# Patient Record
Sex: Male | Born: 1970 | Race: White | Hispanic: No | Marital: Single | Smoking: Former smoker
Health system: Southern US, Community
[De-identification: ages and names within clinical notes are randomized; demographics above are authoritative.]

## PROBLEM LIST (undated history)

## (undated) DIAGNOSIS — Z720 Tobacco use: Secondary | ICD-10-CM

## (undated) DIAGNOSIS — I428 Other cardiomyopathies: Secondary | ICD-10-CM

## (undated) DIAGNOSIS — E785 Hyperlipidemia, unspecified: Secondary | ICD-10-CM

## (undated) DIAGNOSIS — R197 Diarrhea, unspecified: Secondary | ICD-10-CM

## (undated) DIAGNOSIS — F419 Anxiety disorder, unspecified: Secondary | ICD-10-CM

## (undated) DIAGNOSIS — Z992 Dependence on renal dialysis: Secondary | ICD-10-CM

## (undated) DIAGNOSIS — E669 Obesity, unspecified: Secondary | ICD-10-CM

## (undated) DIAGNOSIS — I2510 Atherosclerotic heart disease of native coronary artery without angina pectoris: Secondary | ICD-10-CM

## (undated) DIAGNOSIS — M109 Gout, unspecified: Secondary | ICD-10-CM

## (undated) DIAGNOSIS — R002 Palpitations: Secondary | ICD-10-CM

## (undated) DIAGNOSIS — J189 Pneumonia, unspecified organism: Secondary | ICD-10-CM

## (undated) DIAGNOSIS — I509 Heart failure, unspecified: Secondary | ICD-10-CM

## (undated) DIAGNOSIS — G2581 Restless legs syndrome: Secondary | ICD-10-CM

## (undated) DIAGNOSIS — K219 Gastro-esophageal reflux disease without esophagitis: Secondary | ICD-10-CM

## (undated) DIAGNOSIS — E611 Iron deficiency: Secondary | ICD-10-CM

## (undated) DIAGNOSIS — I10 Essential (primary) hypertension: Secondary | ICD-10-CM

## (undated) DIAGNOSIS — N200 Calculus of kidney: Secondary | ICD-10-CM

## (undated) DIAGNOSIS — J40 Bronchitis, not specified as acute or chronic: Secondary | ICD-10-CM

## (undated) DIAGNOSIS — Q613 Polycystic kidney, unspecified: Secondary | ICD-10-CM

## (undated) DIAGNOSIS — G4730 Sleep apnea, unspecified: Secondary | ICD-10-CM

## (undated) DIAGNOSIS — Z87442 Personal history of urinary calculi: Secondary | ICD-10-CM

## (undated) HISTORY — PX: ANKLE FRACTURE SURGERY: SHX122

## (undated) HISTORY — DX: Palpitations: R00.2

## (undated) HISTORY — DX: Essential (primary) hypertension: I10

## (undated) HISTORY — PX: BUNIONECTOMY: SHX129

## (undated) HISTORY — PX: CARDIAC CATHETERIZATION: SHX172

## (undated) HISTORY — DX: Obesity, unspecified: E66.9

## (undated) HISTORY — DX: Polycystic kidney, unspecified: Q61.3

## (undated) HISTORY — DX: Hyperlipidemia, unspecified: E78.5

## (undated) HISTORY — PX: KNEE ARTHROSCOPY W/ ACL RECONSTRUCTION: SHX1858

## (undated) HISTORY — DX: Gastro-esophageal reflux disease without esophagitis: K21.9

---

## 1998-03-18 ENCOUNTER — Encounter: Admission: RE | Admit: 1998-03-18 | Discharge: 1998-06-16 | Payer: Self-pay | Admitting: Nephrology

## 1998-04-22 ENCOUNTER — Encounter: Payer: Self-pay | Admitting: Emergency Medicine

## 1998-04-22 ENCOUNTER — Emergency Department (HOSPITAL_COMMUNITY): Admission: EM | Admit: 1998-04-22 | Discharge: 1998-04-22 | Payer: Self-pay | Admitting: Emergency Medicine

## 1999-01-26 ENCOUNTER — Encounter: Payer: Self-pay | Admitting: Emergency Medicine

## 1999-01-26 ENCOUNTER — Emergency Department (HOSPITAL_COMMUNITY): Admission: EM | Admit: 1999-01-26 | Discharge: 1999-01-26 | Payer: Self-pay | Admitting: Emergency Medicine

## 1999-01-27 ENCOUNTER — Encounter: Payer: Self-pay | Admitting: Emergency Medicine

## 2000-02-01 ENCOUNTER — Encounter: Payer: Self-pay | Admitting: Urology

## 2000-02-01 ENCOUNTER — Ambulatory Visit (HOSPITAL_COMMUNITY): Admission: RE | Admit: 2000-02-01 | Discharge: 2000-02-01 | Payer: Self-pay | Admitting: Urology

## 2000-02-05 ENCOUNTER — Encounter: Payer: Self-pay | Admitting: Gastroenterology

## 2000-02-05 ENCOUNTER — Encounter: Admission: RE | Admit: 2000-02-05 | Discharge: 2000-02-05 | Payer: Self-pay | Admitting: Gastroenterology

## 2000-04-05 ENCOUNTER — Encounter: Payer: Self-pay | Admitting: Gastroenterology

## 2000-04-05 ENCOUNTER — Ambulatory Visit (HOSPITAL_COMMUNITY): Admission: RE | Admit: 2000-04-05 | Discharge: 2000-04-05 | Payer: Self-pay | Admitting: Gastroenterology

## 2004-01-15 ENCOUNTER — Emergency Department (HOSPITAL_COMMUNITY): Admission: EM | Admit: 2004-01-15 | Discharge: 2004-01-15 | Payer: Self-pay

## 2004-04-19 ENCOUNTER — Emergency Department (HOSPITAL_COMMUNITY): Admission: EM | Admit: 2004-04-19 | Discharge: 2004-04-20 | Payer: Self-pay | Admitting: Emergency Medicine

## 2006-11-27 ENCOUNTER — Encounter: Admission: RE | Admit: 2006-11-27 | Discharge: 2006-11-27 | Payer: Self-pay | Admitting: Nephrology

## 2010-06-09 NOTE — Op Note (Signed)
[place]. [facility]  Patient:    Harold Freeman                        MRN: [id number] Proc. Date: 01/27/99 Adm. Date:  [id number] Attending:  Marlowe Shores                           Operative Report  PREOPERATIVE DIAGNOSIS: Right metacarpal phalangeal joint dislocation.  POSTOPERATIVE DIAGNOSIS: Right metacarpal phalangeal joint dislocation.  PROCEDURE: Closed reduction of above dislocation.  SURGEON: Artist Pais. Mina Marble, M.D.  ANESTHESIA: General.  COMPLICATIONS: None.  DRAINS: None.  DESCRIPTION OF PROCEDURE: The patient was taken to the operating room and after the induction of adequate general anesthesia the right upper extremity was placed in extreme wrist flexion.  Longitudinal traction was then applied to the thumb and  proximal phalanx.  After a period of five minutes of longitudinal traction reduction reduction maneuver was performed.  The metacarpophalangeal joint dislocation was reduced.  Intraoperative x-ray showed adequate reduction in both the AP and lateral plane.  The right upper extremity was then placed in a thumb  spica well-padded short-arm splint and the patient was then taken to the recovery room in stable condition. DD:  01/27/99 TD:  01/27/99 Job: [id number]

## 2010-06-09 NOTE — Op Note (Signed)
[place]. [facility]  Patient:    Harold Freeman                        MRN: [id number] Proc. Date: 01/27/99 Adm. Date:  [id number] Attending:  Marlowe Shores                           Operative Report  PHYSICIAN REQUESTING CONSULTATION:  Dr. Mechele Collin L. Wentz.  REASON FOR CONSULTATION:  Right thumb injury while playing basketball.  HISTORY OF PRESENT ILLNESS:  [name] is a very pleasant 40 year old right-hand-dominant male who was playing basketball earlier this evening and sustained an injury to his dominant right thumb and comes in today with a chief  complaint of pain and deformity.  He is an otherwise very pleasant and healthy 40 year old white male with no known drug allergies.  He is currently take Glucophage for non-insulin-dependent diabetes and has been doing this for about six months.  PAST MEDICAL HISTORY:  Significant for non-insulin-dependent diabetes mellitus, as previously state.  No other significant past medical history.  MEDICATIONS:  No other significant medications.  FAMILY MEDICAL HISTORY:  Noncontributory.  SOCIAL HISTORY:  Noncontributory.  REVIEW OF SYSTEMS:  Noncontributory.  PHYSICAL EXAMINATION:  GENERAL:  He is a well-developed, well-nourished male, pleasant, alert and oriented x 3.  HEENT:  Normocephalic, atraumatic.  HEART:  Regular rate and rhythm.  CHEST:  Clear.  LUNGS:  Clear.  ABDOMEN:  Soft, nontender.  Bowel sounds positive.  No masses felt, etc.  EXTREMITIES:  Examination of his extremity on the right is consistent for an obvious deformity of the metacarpophalangeal joint with hyperextension of the phalanx on the metacarpal head; there is also pain over the volar aspect of the  metacarpophalangeal joint and a prominent metacarpal head.  He is able to actively flex the IP joint but this is quite painful.  He has a 2+ radial pulse and brisk capillary refill.  No signs consistent with  neurovascular compromise.  X-RAY FINDINGS:  X-rays in the emergency department show what appear to be a complex dislocation of the metacarpophalangeal joint with interposition of either the volar plate or FPL tendon in the joint, with the sesamoid bone firmly locked in the joint space.  DESCRIPTION OF PROCEDURE:  Patient was given a 2% lidocaine wrist block in addition to a block of the thumb itself and after five minute, traction was applied, a closed reduction was performed but this appears to be a complex-type dislocation. Patient was then prepped from the emergency room to go to the operating room for a closed-versus-open reduction of a complex right thumb metacarpophalangeal joint  dislocation. DD:  01/27/99 TD:  01/27/99 Job: [id number]

## 2011-03-21 ENCOUNTER — Other Ambulatory Visit: Payer: Self-pay

## 2011-03-21 DIAGNOSIS — Z01810 Encounter for preprocedural cardiovascular examination: Secondary | ICD-10-CM

## 2011-03-21 DIAGNOSIS — N184 Chronic kidney disease, stage 4 (severe): Secondary | ICD-10-CM

## 2011-04-09 ENCOUNTER — Encounter: Payer: Self-pay | Admitting: Surgery

## 2011-04-13 ENCOUNTER — Encounter: Payer: Self-pay | Admitting: Surgery

## 2011-04-16 ENCOUNTER — Ambulatory Visit (INDEPENDENT_AMBULATORY_CARE_PROVIDER_SITE_OTHER): Payer: Managed Care, Other (non HMO) | Admitting: Surgery

## 2011-04-16 ENCOUNTER — Encounter (INDEPENDENT_AMBULATORY_CARE_PROVIDER_SITE_OTHER): Payer: Managed Care, Other (non HMO) | Admitting: *Deleted

## 2011-04-16 ENCOUNTER — Encounter: Payer: Self-pay | Admitting: Surgery

## 2011-04-16 VITALS — BP 142/91 | HR 88 | Resp 16 | Ht 73.0 in | Wt 278.0 lb

## 2011-04-16 DIAGNOSIS — Z01810 Encounter for preprocedural cardiovascular examination: Secondary | ICD-10-CM

## 2011-04-16 DIAGNOSIS — N186 End stage renal disease: Secondary | ICD-10-CM

## 2011-04-16 DIAGNOSIS — Q612 Polycystic kidney, adult type: Secondary | ICD-10-CM

## 2011-04-16 DIAGNOSIS — N184 Chronic kidney disease, stage 4 (severe): Secondary | ICD-10-CM

## 2011-04-16 NOTE — Progress Notes (Signed)
[facility] [place]   Patient name: Harold Freeman MRN: [id number] DOB: 05-30-1970 Sex: male   Referred by: Dr. Detterding  Reason for referral:  Chief Complaint  Patient presents with  . New Evaluation    AVF Ref. by Dr. Darrick Penna - with Labs     HISTORY OF PRESENT ILLNESS: The patient comes in today for evaluation of dialysis access. He is right-handed. His renal disease is secondary to polycystic kidney disease. Several family members are already on dialysis including his sister and mother. The patient also suffers and diabetes. His blood sugars were in the 100-125 range. He is a smoker and has been so for greater than 10 years. He is not yet on dialysis. He is also medically managed for his hypertension and hypercholesterolemia  Past Medical History  Diagnosis Date  . Polycystic kidney disease   . Arthritis     gout  . Diabetes mellitus   . Obesity   . Hypertension   . Hyperlipidemia   . GERD (gastroesophageal reflux disease)     History reviewed. No pertinent past surgical history.  History   Social History  . Marital Status: Married    Spouse Name: N/A    Number of Children: N/A  . Years of Education: N/A   Occupational History  . Not on file.   Social History Main Topics  . Smoking status: Current Everyday Smoker  . Smokeless tobacco: Not on file  . Alcohol Use: No  . Drug Use: No  . Sexually Active:    Other Topics Concern  . Not on file   Social History Narrative  . No narrative on file    Family History  Problem Relation Age of Onset  . Diabetes Mother   . Hypertension Mother   . Cancer Father   . Diabetes Father   . Hypertension Father   . Diabetes Sister     Allergies as of 04/16/2011  . (Not on File)    Current Outpatient Prescriptions on File Prior to Visit  Medication Sig Dispense Refill  . aspirin 81 MG tablet Take 81 mg by mouth daily.      Marland Kitchen glimepiride (AMARYL) 2 MG tablet Take 2 mg by mouth daily before  breakfast.      . Multiple Vitamin (MULTIVITAMIN) tablet Take 1 tablet by mouth daily.      Marland Kitchen omeprazole (PRILOSEC) 20 MG capsule Take 20 mg by mouth daily.      . potassium chloride (KLOR-CON) 20 MEQ packet Take 20 mEq by mouth as needed.      Marland Kitchen SIMVASTATIN PO Take by mouth.      . telmisartan (MICARDIS) 80 MG tablet Take 80 mg by mouth daily.         REVIEW OF SYSTEMS: Cardiovascular: No chest pain, chest pressure, palpitations, orthopnea, or dyspnea on exertion. No claudication or rest pain,  No history of DVT or phlebitis. Pulmonary: No productive cough, asthma or wheezing. Neurologic: No weakness, paresthesias, aphasia, or amaurosis. No dizziness. Hematologic: No bleeding problems or clotting disorders. Musculoskeletal: No joint pain or joint swelling. Gastrointestinal: No blood in stool or hematemesis Genitourinary: No dysuria or hematuria. Psychiatric:: No history of major depression. Integumentary: No rashes or ulcers. Constitutional: No fever or chills.  PHYSICAL EXAMINATION: General: The patient appears their stated age.  Vital signs are BP 142/91  Pulse 88  Resp 16  Ht 6\' 1"  (1.854 m)  Wt 278 lb (126.1 kg)  BMI 36.68 kg/m2  SpO2  97% HEENT:  No gross abnormalities Pulmonary: Respirations are non-labored Abdomen: Soft and non-tender  Musculoskeletal: There are no major deformities.   Neurologic: No focal weakness or paresthesias are detected, Skin: There are no ulcer or rashes noted. Psychiatric: The patient has normal affect. Cardiovascular: There is a regular rate and rhythm without significant murmur appreciated. Palpable left radial left brachial pulse  Diagnostic Studies: Vein mapping was performed today which shows an excellent left cephalic vein it measures 0.38 in the distal arm.  Assessment:  Chronic renal insufficiency Plan: The patient is been scheduled for a left radiocephalic fistula to be performed on Friday, April 26. The risks and benefits of the  procedure were discussed with the patient including the risk of steal syndrome in the risk of non-maturity as well as the risk and need for future interventions. All of his questions were answered today.     Jorge Ny, M.D. [facility] [place] Office: [phone] Pager:  [phone]

## 2011-04-30 NOTE — Procedures (Unsigned)
CEPHALIC VEIN MAPPING  INDICATION:  Preop for AV fistula placement, polycystic kidney disease.  HISTORY:  EXAM:  The right cephalic vein is compressible with diameter measurements ranging from 0.37 to 0.56 cm.  The right basilic vein is compressible with diameter measurements ranging from 0.42 to 0.56 cm.  The left cephalic vein is compressible with diameter measurements ranging from 0.37 to 0.49 cm.  The left basilic vein is compressible with diameter measurements ranging from 0.23 to 0.43 cm.  See attached worksheet for all measurements.  IMPRESSION:  Patent bilateral cephalic and basilic veins with diameter measurements described above.  ___________________________________________ V. Charlena Cross, MD  CH/MEDQ  D:  04/17/2011  T:  04/17/2011  Job:  [id number]

## 2011-05-09 ENCOUNTER — Other Ambulatory Visit: Payer: Self-pay | Admitting: *Deleted

## 2011-05-09 ENCOUNTER — Encounter: Payer: Self-pay | Admitting: *Deleted

## 2011-05-10 ENCOUNTER — Encounter (HOSPITAL_COMMUNITY): Payer: Self-pay | Admitting: Pharmacy Technician

## 2011-05-17 ENCOUNTER — Encounter (HOSPITAL_COMMUNITY): Payer: Self-pay

## 2011-05-17 MED ORDER — SODIUM CHLORIDE 0.9 % IV SOLN
INTRAVENOUS | Status: DC
  Administered 2011-05-18: 10:00:00 via INTRAVENOUS

## 2011-05-17 MED ORDER — DEXTROSE 5 % IV SOLN
1.5000 g | INTRAVENOUS | Status: AC
  Administered 2011-05-18: 1.5 g via INTRAVENOUS
  Filled 2011-05-17: qty 1.5

## 2011-05-18 ENCOUNTER — Encounter (HOSPITAL_COMMUNITY): Payer: Self-pay | Admitting: Certified Registered Nurse Anesthetist

## 2011-05-18 ENCOUNTER — Encounter (HOSPITAL_COMMUNITY): Payer: Self-pay | Admitting: *Deleted

## 2011-05-18 ENCOUNTER — Encounter (HOSPITAL_COMMUNITY): Payer: Self-pay | Admitting: Anesthesiology

## 2011-05-18 ENCOUNTER — Encounter (HOSPITAL_COMMUNITY)
Admission: RE | Disposition: A | Discharge status: Home / Self Care | Payer: Self-pay | Source: Ambulatory Visit | Attending: Vascular Surgery

## 2011-05-18 ENCOUNTER — Ambulatory Visit (HOSPITAL_COMMUNITY): Payer: Managed Care, Other (non HMO)

## 2011-05-18 ENCOUNTER — Telehealth: Payer: Self-pay | Admitting: Surgery

## 2011-05-18 ENCOUNTER — Ambulatory Visit (HOSPITAL_COMMUNITY): Payer: Managed Care, Other (non HMO) | Admitting: Anesthesiology

## 2011-05-18 ENCOUNTER — Ambulatory Visit (HOSPITAL_COMMUNITY)
Admission: RE | Admit: 2011-05-18 | Discharge: 2011-05-18 | Disposition: A | Discharge status: Home / Self Care | Payer: Managed Care, Other (non HMO) | Source: Ambulatory Visit | Attending: Vascular Surgery | Admitting: Vascular Surgery

## 2011-05-18 DIAGNOSIS — N184 Chronic kidney disease, stage 4 (severe): Secondary | ICD-10-CM | POA: Insufficient documentation

## 2011-05-18 DIAGNOSIS — N186 End stage renal disease: Secondary | ICD-10-CM

## 2011-05-18 DIAGNOSIS — Z87891 Personal history of nicotine dependence: Secondary | ICD-10-CM | POA: Insufficient documentation

## 2011-05-18 DIAGNOSIS — Q613 Polycystic kidney, unspecified: Secondary | ICD-10-CM | POA: Insufficient documentation

## 2011-05-18 DIAGNOSIS — I129 Hypertensive chronic kidney disease with stage 1 through stage 4 chronic kidney disease, or unspecified chronic kidney disease: Secondary | ICD-10-CM | POA: Insufficient documentation

## 2011-05-18 DIAGNOSIS — E119 Type 2 diabetes mellitus without complications: Secondary | ICD-10-CM | POA: Insufficient documentation

## 2011-05-18 DIAGNOSIS — K219 Gastro-esophageal reflux disease without esophagitis: Secondary | ICD-10-CM | POA: Insufficient documentation

## 2011-05-18 HISTORY — PX: AV FISTULA PLACEMENT: SHX1204

## 2011-05-18 HISTORY — DX: Bronchitis, not specified as acute or chronic: J40

## 2011-05-18 LAB — SURGICAL PCR SCREEN: MRSA, PCR: NEGATIVE

## 2011-05-18 LAB — PROTIME-INR: Prothrombin Time: 13 seconds (ref 11.6–15.2)

## 2011-05-18 LAB — POCT I-STAT 4, (NA,K, GLUC, HGB,HCT)
Hemoglobin: 11.9 g/dL — ABNORMAL LOW (ref 13.0–17.0)
Sodium: 138 mEq/L (ref 135–145)

## 2011-05-18 LAB — GLUCOSE, CAPILLARY

## 2011-05-18 SURGERY — ARTERIOVENOUS (AV) FISTULA CREATION
Anesthesia: General | Site: Arm Lower | Laterality: Left | Wound class: Clean

## 2011-05-18 MED ORDER — HYDROMORPHONE HCL PF 1 MG/ML IJ SOLN: 0.2500 mg | INTRAMUSCULAR | Status: DC | PRN

## 2011-05-18 MED ORDER — FENTANYL CITRATE 0.05 MG/ML IJ SOLN
INTRAMUSCULAR | Status: DC | PRN
  Administered 2011-05-18: 100 ug via INTRAVENOUS
  Administered 2011-05-18: 50 ug via INTRAVENOUS
  Administered 2011-05-18 (×2): 25 ug via INTRAVENOUS

## 2011-05-18 MED ORDER — HEPARIN SODIUM (PORCINE) 1000 UNIT/ML IJ SOLN
INTRAMUSCULAR | Status: DC | PRN
  Administered 2011-05-18: 3000 [IU] via INTRAVENOUS

## 2011-05-18 MED ORDER — OXYCODONE HCL 5 MG PO TABS: 5.0000 mg | ORAL_TABLET | ORAL | Status: AC | PRN

## 2011-05-18 MED ORDER — EPHEDRINE SULFATE 50 MG/ML IJ SOLN
INTRAMUSCULAR | Status: DC | PRN
  Administered 2011-05-18: 10 mg via INTRAVENOUS
  Administered 2011-05-18: 5 mg via INTRAVENOUS
  Administered 2011-05-18: 15 mg via INTRAVENOUS
  Administered 2011-05-18: 10 mg via INTRAVENOUS

## 2011-05-18 MED ORDER — LIDOCAINE HCL (CARDIAC) 20 MG/ML IV SOLN
INTRAVENOUS | Status: DC | PRN
  Administered 2011-05-18: 60 mg via INTRAVENOUS

## 2011-05-18 MED ORDER — LACTATED RINGERS IV SOLN
INTRAVENOUS | Status: DC | PRN
  Administered 2011-05-18: 10:00:00 via INTRAVENOUS

## 2011-05-18 MED ORDER — MIDAZOLAM HCL 5 MG/5ML IJ SOLN
INTRAMUSCULAR | Status: DC | PRN
  Administered 2011-05-18: 2 mg via INTRAVENOUS

## 2011-05-18 MED ORDER — PROPOFOL 10 MG/ML IV EMUL
INTRAVENOUS | Status: DC | PRN
  Administered 2011-05-18: 200 mg via INTRAVENOUS

## 2011-05-18 MED ORDER — 0.9 % SODIUM CHLORIDE (POUR BTL) OPTIME
TOPICAL | Status: DC | PRN
  Administered 2011-05-18: 1000 mL

## 2011-05-18 MED ORDER — MUPIROCIN 2 % EX OINT
TOPICAL_OINTMENT | CUTANEOUS | Status: AC
  Filled 2011-05-18: qty 22

## 2011-05-18 MED ORDER — ONDANSETRON HCL 4 MG/2ML IJ SOLN
4.0000 mg | Freq: Once | INTRAMUSCULAR | Status: AC
  Administered 2011-05-18: 4 mg via INTRAVENOUS

## 2011-05-18 MED ORDER — SODIUM CHLORIDE 0.9 % IR SOLN
Status: DC | PRN
  Administered 2011-05-18: 10:00:00

## 2011-05-18 MED ORDER — PROTAMINE SULFATE 10 MG/ML IV SOLN
INTRAVENOUS | Status: DC | PRN
  Administered 2011-05-18: 20 mg via INTRAVENOUS
  Administered 2011-05-18: 5 mg via INTRAVENOUS

## 2011-05-18 SURGICAL SUPPLY — 41 items
ADH SKN CLS APL DERMABOND .7 (GAUZE/BANDAGES/DRESSINGS) ×1
CANISTER SUCTION 2500CC (MISCELLANEOUS) ×2 IMPLANT
CLIP TI MEDIUM 6 (CLIP) ×2 IMPLANT
CLIP TI WIDE RED SMALL 6 (CLIP) ×2 IMPLANT
CLOTH BEACON ORANGE TIMEOUT ST (SAFETY) ×2 IMPLANT
COVER PROBE W GEL 5X96 (DRAPES) ×1 IMPLANT
COVER SURGICAL LIGHT HANDLE (MISCELLANEOUS) ×4 IMPLANT
DERMABOND ADVANCED (GAUZE/BANDAGES/DRESSINGS) ×1
DERMABOND ADVANCED .7 DNX12 (GAUZE/BANDAGES/DRESSINGS) ×1 IMPLANT
ELECT REM PT RETURN 9FT ADLT (ELECTROSURGICAL) ×2
ELECTRODE REM PT RTRN 9FT ADLT (ELECTROSURGICAL) ×1 IMPLANT
GEL ULTRASOUND 20GR AQUASONIC (MISCELLANEOUS) ×1 IMPLANT
GLOVE BIO SURGEON STRL SZ 6.5 (GLOVE) ×1 IMPLANT
GLOVE BIOGEL PI IND STRL 6.5 (GLOVE) IMPLANT
GLOVE BIOGEL PI IND STRL 7.0 (GLOVE) IMPLANT
GLOVE BIOGEL PI IND STRL 7.5 (GLOVE) IMPLANT
GLOVE BIOGEL PI INDICATOR 6.5 (GLOVE) ×1
GLOVE BIOGEL PI INDICATOR 7.0 (GLOVE) ×1
GLOVE BIOGEL PI INDICATOR 7.5 (GLOVE) ×1
GLOVE SS BIOGEL STRL SZ 7 (GLOVE) ×1 IMPLANT
GLOVE SUPERSENSE BIOGEL SZ 7 (GLOVE)
GLOVE SURG SS PI 6.0 STRL IVOR (GLOVE) ×2 IMPLANT
GLOVE SURG SS PI 7.5 STRL IVOR (GLOVE) ×1 IMPLANT
GOWN BRE IMP PREV XXLGXLNG (GOWN DISPOSABLE) ×1 IMPLANT
GOWN STRL NON-REIN LRG LVL3 (GOWN DISPOSABLE) ×4 IMPLANT
KIT BASIN OR (CUSTOM PROCEDURE TRAY) ×2 IMPLANT
KIT ROOM TURNOVER OR (KITS) ×2 IMPLANT
NS IRRIG 1000ML POUR BTL (IV SOLUTION) ×2 IMPLANT
PACK CV ACCESS (CUSTOM PROCEDURE TRAY) ×2 IMPLANT
PAD ARMBOARD 7.5X6 YLW CONV (MISCELLANEOUS) ×4 IMPLANT
SPONGE GAUZE 4X4 12PLY (GAUZE/BANDAGES/DRESSINGS) ×2 IMPLANT
SUT PROLENE 6 0 BV (SUTURE) ×2 IMPLANT
SUT PROLENE 7 0 BV 1 (SUTURE) ×1 IMPLANT
SUT VIC AB 3-0 SH 27 (SUTURE) ×2
SUT VIC AB 3-0 SH 27X BRD (SUTURE) ×1 IMPLANT
SUT VICRYL 4-0 PS2 18IN ABS (SUTURE) ×1 IMPLANT
TOWEL OR 17X24 6PK STRL BLUE (TOWEL DISPOSABLE) ×2 IMPLANT
TOWEL OR 17X26 10 PK STRL BLUE (TOWEL DISPOSABLE) ×2 IMPLANT
UNDERPAD 30X30 INCONTINENT (UNDERPADS AND DIAPERS) ×2 IMPLANT
WATER STERILE IRR 1000ML POUR (IV SOLUTION) ×2 IMPLANT
YANKAUER SUCT BULB TIP NO VENT (SUCTIONS) ×1 IMPLANT

## 2011-05-18 NOTE — Telephone Encounter (Signed)
Patient notified of appointment via telephone call. Mailed letter to home address also regarding scheduled appointment.   

## 2011-05-18 NOTE — H&P (Signed)
[facility] [place]     Patient name: Harold Freeman          MRN: [id number]        DOB: 1970-10-09          Sex: male     Referred by: Dr. Detterding   Reason for referral:  Chief Complaint   Patient presents with   .  New Evaluation       AVF Ref. by Dr. Darrick Penna - with Labs       HISTORY OF PRESENT ILLNESS: The patient comes in today for evaluation of dialysis access. He is right-handed. His renal disease is secondary to polycystic kidney disease. Several family members are already on dialysis including his sister and mother. The patient also suffers and diabetes. His blood sugars were in the 100-125 range. He is a smoker and has been so for greater than 10 years. He is not yet on dialysis. He is also medically managed for his hypertension and hypercholesterolemia    Past Medical History   Diagnosis  Date   .  Polycystic kidney disease     .  Arthritis         gout   .  Diabetes mellitus     .  Obesity     .  Hypertension     .  Hyperlipidemia     .  GERD (gastroesophageal reflux disease)        History reviewed. No pertinent past surgical history.    History       Social History   .  Marital Status:  Married       Spouse Name:  N/A       Number of Children:  N/A   .  Years of Education:  N/A       Occupational History   .  Not on file.       Social History Main Topics   .  Smoking status:  Current Everyday Smoker   .  Smokeless tobacco:  Not on file   .  Alcohol Use:  No   .  Drug Use:  No   .  Sexually Active:         Other Topics  Concern   .  Not on file       Social History Narrative   .  No narrative on file       Family History   Problem  Relation  Age of Onset   .  Diabetes  Mother     .  Hypertension  Mother     .  Cancer  Father     .  Diabetes  Father     .  Hypertension  Father     .  Diabetes  Sister         Allergies as of 04/16/2011   .  (Not on File)       Current Outpatient Prescriptions on  File Prior to Visit   Medication  Sig  Dispense  Refill   .  aspirin 81 MG tablet  Take 81 mg by mouth daily.         Marland Kitchen  glimepiride (AMARYL) 2 MG tablet  Take 2 mg by mouth daily before breakfast.         .  Multiple Vitamin (MULTIVITAMIN) tablet  Take 1 tablet by mouth daily.         Marland Kitchen  omeprazole (PRILOSEC) 20 MG capsule  Take 20 mg by mouth daily.         .  potassium chloride (KLOR-CON) 20 MEQ packet  Take 20 mEq by mouth as needed.         Marland Kitchen  SIMVASTATIN PO  Take by mouth.         .  telmisartan (MICARDIS) 80 MG tablet  Take 80 mg by mouth daily.              REVIEW OF SYSTEMS: Cardiovascular: No chest pain, chest pressure, palpitations, orthopnea, or dyspnea on exertion. No claudication or rest pain,  No history of DVT or phlebitis. Pulmonary: No productive cough, asthma or wheezing. Neurologic: No weakness, paresthesias, aphasia, or amaurosis. No dizziness. Hematologic: No bleeding problems or clotting disorders. Musculoskeletal: No joint pain or joint swelling. Gastrointestinal: No blood in stool or hematemesis Genitourinary: No dysuria or hematuria. Psychiatric:: No history of major depression. Integumentary: No rashes or ulcers. Constitutional: No fever or chills.   PHYSICAL EXAMINATION: General: The patient appears their stated age.  Vital signs are BP 142/91  Pulse 88  Resp 16  Ht 6\' 1"  (1.854 m)  Wt 278 lb (126.1 kg)  BMI 36.68 kg/m2  SpO2 97% HEENT:  No gross abnormalities Pulmonary: Respirations are non-labored Abdomen: Soft and non-tender   Musculoskeletal: There are no major deformities.    Neurologic: No focal weakness or paresthesias are detected, Skin: There are no ulcer or rashes noted. Psychiatric: The patient has normal affect. Cardiovascular: There is a regular rate and rhythm without significant murmur appreciated. Palpable left radial left brachial pulse   Diagnostic Studies: Vein mapping was performed today which shows an excellent left  cephalic vein it measures 0.38 in the distal arm.   Assessment:   Chronic renal insufficiency Plan: The patient is been scheduled for a left radiocephalic fistula to be performed on Friday, April 26. The risks and benefits of the procedure were discussed with the patient including the risk of steal syndrome in the risk of non-maturity as well as the risk and need for future interventions. All of his questions were answered today.         Jorge Ny, M.D. [facility] [place] Office: [phone] Pager:  [phone]                Not recorded    Transcription         VV Cephalic Vein Mapping by Provider Default, MD on 04/17/2011 11:59 AM                                CEPHALIC VEIN MAPPING   INDICATION:  Preop for AV fistula placement, polycystic kidney disease.   HISTORY:   EXAM:  The right cephalic vein is compressible with diameter measurements ranging from 0.37 to 0.56 cm.   The right basilic vein is compressible with diameter measurements ranging from 0.42 to 0.56 cm.   The left cephalic vein is compressible with diameter measurements ranging from 0.37 to 0.49 cm.   The left basilic vein is compressible with diameter measurements ranging from 0.23 to 0.43 cm.   See attached worksheet for all measurements.   IMPRESSION:  Patent bilateral cephalic and basilic veins with diameter measurements described above.   ___________________________________________ V. Charlena Cross, MD   CH/MEDQ  D:  04/17/2011  T:  04/17/2011  Job:  [id number]

## 2011-05-18 NOTE — Preoperative (Signed)
Beta Blockers   Reason not to administer Beta Blockers:Not Applicable 

## 2011-05-18 NOTE — Anesthesia Postprocedure Evaluation (Signed)
Anesthesia Post Note  Patient: Harold Freeman  Procedure(s) Performed: Procedure(s) (LRB): ARTERIOVENOUS (AV) FISTULA CREATION (Left)  Anesthesia type: General  Patient location: PACU  Post pain: Pain level controlled and Adequate analgesia  Post assessment: Post-op Vital signs reviewed, Patient's Cardiovascular Status Stable, Respiratory Function Stable, Patent Airway and Pain level controlled  Last Vitals:  Filed Vitals:   05/18/11 1216  BP:   Pulse:   Temp:   Resp: 10    Post vital signs: Reviewed and stable  Level of consciousness: awake, alert  and oriented  Complications: No apparent anesthesia complications

## 2011-05-18 NOTE — Progress Notes (Signed)
Report to Angel RN as caregiver  

## 2011-05-18 NOTE — Op Note (Signed)
[facility] [place]  Patient name: Harold Freeman MRN: [id number] DOB: 1970-12-31 Sex: male  05/18/2011 Pre-operative Diagnosis: CKD, Stage 4 Post-operative diagnosis:  Same Surgeon:  Jorge Ny Assistants:  Della Goo, P.A. Procedure:   Left radio-cephalic fistula Anesthesia:  General Blood Loss:  See anesthesia record Specimens:  none  Findings:  4.5 mm vein, 3 mm artery  Indications:  This is a right handed male with stage 4 renal insufficiency secondary to PCKD in need of permanent access  Procedure:  The patient was identified in the holding area and taken to [facility] OR ROOM 09  The patient was then placed supine on the table. general anesthesia was administered.  The patient was prepped and draped in the usual sterile fashion.  A time out was called and antibiotics were administered.  The cephalic vein was mapped out with ultrasound.  A longitudinal incision was made between the cephalic vein and radial artery.  The vein was dissected out first.  A skin flap was created due to the distance between the vein and artery.  The cephalic vein was about 4.5 mm in diameter.  It was mobilized proximally and distally and marked with a ink pen for orientation.  Side branches were ligated between 3-0 silk ties.  Next, the artery was sharply dissected out.  It was disease free and about 3mm.  The patient was given 3000 units of heparin.  The vein was ligated distally with a 3-0 silk tie.  The artery was occluded with vascular clamps.  A #11 blade was used to make an arteriotomy and extended longitudinally with potts scissors.The vein was cut to the appropriate length and spatulated to fit the size of the arteriotomy.  Sequential dilators were passed up the vein, up to a 4mm dilator.  Heparin saline was then used to flush the vein.  An end to side anastomosis was created with a running 6-0 prolene suture.  Prior to completion, the appropriate flush maneuvers were performed,  and the anastomosis was completed.  There was a good thrill in the vein.  There were multiphasic signals in the radial and ulnar artery distal to the anastomosis.ibuprofen then made a second incision over a large branch in the cephalic vein.  The branch was isolated and ligated with 2 3-0 silk ties.  I then inspected the course of the vein to make sure there were no kinks.  25 mg of protamine was given.  Once hemostasis was adequate, the skin incisions were closed with 2 layers of 3-0 silk ties.  Dermabond was placed.   Disposition:  To PACU in stable condition.   Juleen China, M.D. [facility] [place] Office: [phone] Pager:  [phone]

## 2011-05-18 NOTE — Transfer of Care (Signed)
Immediate Anesthesia Transfer of Care Note  Patient: Harold Freeman  Procedure(s) Performed: Procedure(s) (LRB): ARTERIOVENOUS (AV) FISTULA CREATION (Left)  Patient Location: PACU  Anesthesia Type: General  Level of Consciousness: awake, alert  and oriented  Airway & Oxygen Therapy: Patient Spontanous Breathing and Patient connected to face mask oxygen  Post-op Assessment: Report given to PACU RN, Post -op Vital signs reviewed and stable and Patient moving all extremities X 4  Post vital signs: Reviewed and stable  Complications: No apparent anesthesia complications

## 2011-05-18 NOTE — Anesthesia Preprocedure Evaluation (Addendum)
Anesthesia Evaluation  Patient identified by MRN, date of birth, ID band Patient awake    Reviewed: Allergy & Precautions, H&P , NPO status , Patient's Chart, lab work & pertinent test results  Airway Mallampati: II TM Distance: >3 FB Neck ROM: Full    Dental No notable dental hx. (+) Teeth Intact and Dental Advisory Given   Pulmonary neg pulmonary ROS, former smoker breath sounds clear to auscultation  Pulmonary exam normal       Cardiovascular hypertension, On Medications Rhythm:Regular Rate:Normal     Neuro/Psych negative neurological ROS  negative psych ROS   GI/Hepatic Neg liver ROS, GERD-  Medicated and Controlled,  Endo/Other  negative endocrine ROSDiabetes mellitus-, Type 2, Oral Hypoglycemic AgentsMorbid obesityGout  Renal/GU CRFRenal diseasePolycystic kidney dz  negative genitourinary   Musculoskeletal   Abdominal   Peds  Hematology negative hematology ROS (+)   Anesthesia Other Findings   Reproductive/Obstetrics negative OB ROS                          Anesthesia Physical Anesthesia Plan  ASA: III  Anesthesia Plan: General   Post-op Pain Management:    Induction: Intravenous  Airway Management Planned: LMA  Additional Equipment:   Intra-op Plan:   Post-operative Plan: Extubation in OR  Informed Consent: I have reviewed the patients History and Physical, chart, labs and discussed the procedure including the risks, benefits and alternatives for the proposed anesthesia with the patient or authorized representative who has indicated his/her understanding and acceptance.   Dental advisory given  Plan Discussed with: CRNA  Anesthesia Plan Comments:         Anesthesia Quick Evaluation

## 2011-05-18 NOTE — Telephone Encounter (Signed)
Message copied by Fredrich Birks on Fri May 18, 2011  3:09 PM ------      Message from: Melene Plan      Created: Fri May 18, 2011  2:16 PM                   ----- Message -----         From: Marlowe Shores, [place]         Sent: 05/18/2011  12:01 PM           To: Melene Plan, RN            4 week FU AVF - Myra Gianotti

## 2011-05-21 ENCOUNTER — Encounter (HOSPITAL_COMMUNITY): Payer: Self-pay | Admitting: Surgery

## 2011-05-21 ENCOUNTER — Telehealth: Payer: Self-pay | Admitting: Surgery

## 2011-05-21 NOTE — Telephone Encounter (Signed)
Message copied by Sara Chu on Mon May 21, 2011  4:01 PM ------      Message from: Melene Plan      Created: Mon May 21, 2011 10:35 AM                   ----- Message -----         From: Nada Libman, MD         Sent: 05/18/2011  10:11 PM           To: Cydney Ok, Melene Plan, RN            05/18/2011. The patient had the following procedures:            Left radio-cephalic fistula            Rene Kocher was the assistant                  Please have the patient follow up with ME in 6 weeks with an ultrasound of his fistula

## 2011-05-28 ENCOUNTER — Telehealth: Payer: Self-pay | Admitting: Surgery

## 2011-05-28 NOTE — Telephone Encounter (Signed)
Patient left message on ext [phone], has ?s about post op appts. LVM @ [phone] with my direct # [phone] to call back after 1:00 when I return from lunch.

## 2011-06-08 ENCOUNTER — Telehealth: Payer: Self-pay | Admitting: *Deleted

## 2011-06-08 NOTE — Telephone Encounter (Signed)
Patient called inquiring about avf and scab that he had trimmed that repeatedly caught on his clothing. After trimming the scab another small area came off and patient had concerns if this was harming to him. I explained to him that keeping it clean was the crucial thing at this time. Patient denied redness, drainage or bleeding. I instructed patient to cover exposed area lightly with gauze when needed. Patient instructed to call if any changes, he understood directions.

## 2011-06-11 NOTE — Telephone Encounter (Signed)
06/11/11- pt did not return phone calls to my direct #. Have lvm for pt to call if we can help him with his appt questions, dpm

## 2011-06-22 ENCOUNTER — Encounter: Payer: Self-pay | Admitting: Surgery

## 2011-06-22 ENCOUNTER — Other Ambulatory Visit: Payer: Self-pay | Admitting: *Deleted

## 2011-06-22 DIAGNOSIS — T82590A Other mechanical complication of surgically created arteriovenous fistula, initial encounter: Secondary | ICD-10-CM

## 2011-06-22 DIAGNOSIS — N186 End stage renal disease: Secondary | ICD-10-CM

## 2011-06-25 ENCOUNTER — Ambulatory Visit: Payer: Managed Care, Other (non HMO) | Admitting: Surgery

## 2011-06-25 ENCOUNTER — Ambulatory Visit (INDEPENDENT_AMBULATORY_CARE_PROVIDER_SITE_OTHER): Payer: Managed Care, Other (non HMO) | Admitting: Surgery

## 2011-06-25 ENCOUNTER — Ambulatory Visit (INDEPENDENT_AMBULATORY_CARE_PROVIDER_SITE_OTHER): Payer: Managed Care, Other (non HMO) | Admitting: Vascular Surgery

## 2011-06-25 ENCOUNTER — Encounter: Payer: Self-pay | Admitting: Surgery

## 2011-06-25 VITALS — BP 133/85 | HR 87 | Temp 97.8°F | Resp 16 | Ht 74.0 in | Wt 287.2 lb

## 2011-06-25 DIAGNOSIS — T82598A Other mechanical complication of other cardiac and vascular devices and implants, initial encounter: Secondary | ICD-10-CM

## 2011-06-25 DIAGNOSIS — N186 End stage renal disease: Secondary | ICD-10-CM

## 2011-06-25 DIAGNOSIS — Z48812 Encounter for surgical aftercare following surgery on the circulatory system: Secondary | ICD-10-CM

## 2011-06-25 NOTE — Progress Notes (Signed)
Left avf duplex performed @ [facility] 06/25/2011

## 2011-06-25 NOTE — Progress Notes (Signed)
The patient is status post left radiocephalic fistula creation on 05/18/2011. This was done for stage IV chronic renal insufficiency secondary to polycystic kidney disease. He had an excellent artery and vein at the time of his operation. He is back today for followup. He has no symptoms of steal. His wound has healed nicely.  I have ordered and reviewed his ultrasound that shows vein diameters ranging from 0.54-0.71 cm. There is one competing branch.  There is good thrill within the fistula to the metformin and become somewhat difficult to feel however on compression the vein does appear to be enlarging nicely.  Since the patient is not yet requiring dialysis I recommended continued observation to see if the pain continues to improve. I'll see him back in 2 months with a repeat ultrasound to reevaluate the size of the vein at that time. If it is still difficult to palpate his fistula I would consider proceeding with a fistulogram versus branch ligation. I've make that determination based on what it looks like in 2 months.

## 2011-07-03 ENCOUNTER — Telehealth: Payer: Self-pay | Admitting: Surgery

## 2011-07-03 NOTE — Telephone Encounter (Addendum)
Message copied by Shari Prows on Tue Jul 03, 2011 11:41 AM ------      Message from: Melene Plan      Created: Tue Jul 03, 2011 11:09 AM                   ----- Message -----         From: Nada Libman, MD         Sent: 07/02/2011   8:39 PM           To: Melene Plan, RN            Please schedule him to come by to see me in the office within the next several weeks. He had an appointment in August however Dr. Detterding once me to see him sooner to intervene on his fistula.  I scheduled an appointment for Monday 07/23/11 at 10am w/ [facility] per above staff message. I left message for the patient regarding this appt and also mailed letter. Jacklyn Shell

## 2011-07-09 NOTE — Procedures (Unsigned)
VASCULAR LAB EXAM  INDICATION:  Follow up arteriovenous fistula placement, end-stage renal disease.  HISTORY: Diabetes:  Yes Cardiac:  No Hypertension:  Yes Smoking:  Previous  EXAM:  Left radiocephalic arteriovenous fistula duplex.  IMPRESSION: 1. Left arterial inflow appears patent. 2. Left arterial outflow presents with elevated velocity suggesting     stenosis with a peak systolic velocity of 897 cm/sec. 3. Left arteriovenous fistula anastomosis appears patent. 4. Left venous outflow is patent to the level of the proximal arm. 5. Left venous outflow depth and diameters are as noted on worksheet. 6. A competing branch is noted involving the mid forearm venous     outflow measuring 0.46 cm in diameter with a peak systolic velocity     of 155 cm/sec.  ___________________________________________ V. Charlena Cross, MD  SH/MEDQ  D:  06/25/2011  T:  06/25/2011  Job:  [id number]

## 2011-07-20 ENCOUNTER — Telehealth: Payer: Self-pay | Admitting: Surgery

## 2011-07-20 NOTE — Telephone Encounter (Signed)
Pt. Called to reschedule his appt. On 07-23-11 stating he would be out of town.  I r/s appt. To Aug. 5, 2013 at 10 a.m.

## 2011-07-23 ENCOUNTER — Ambulatory Visit: Payer: Managed Care, Other (non HMO) | Admitting: Surgery

## 2011-08-21 ENCOUNTER — Other Ambulatory Visit: Payer: Self-pay | Admitting: *Deleted

## 2011-08-21 DIAGNOSIS — Z4931 Encounter for adequacy testing for hemodialysis: Secondary | ICD-10-CM

## 2011-08-21 DIAGNOSIS — N186 End stage renal disease: Secondary | ICD-10-CM

## 2011-08-27 ENCOUNTER — Ambulatory Visit: Payer: Managed Care, Other (non HMO) | Admitting: Surgery

## 2011-08-27 ENCOUNTER — Ambulatory Visit: Payer: Managed Care, Other (non HMO) | Admitting: Neurosurgery

## 2011-09-07 ENCOUNTER — Encounter: Payer: Self-pay | Admitting: Surgery

## 2011-09-10 ENCOUNTER — Encounter: Payer: Self-pay | Admitting: Surgery

## 2011-09-10 ENCOUNTER — Ambulatory Visit (INDEPENDENT_AMBULATORY_CARE_PROVIDER_SITE_OTHER): Payer: Managed Care, Other (non HMO) | Admitting: Surgery

## 2011-09-10 ENCOUNTER — Ambulatory Visit (INDEPENDENT_AMBULATORY_CARE_PROVIDER_SITE_OTHER): Payer: Managed Care, Other (non HMO) | Admitting: Vascular Surgery

## 2011-09-10 VITALS — BP 117/70 | HR 89 | Resp 18 | Ht 73.0 in | Wt 285.3 lb

## 2011-09-10 DIAGNOSIS — N186 End stage renal disease: Secondary | ICD-10-CM

## 2011-09-10 DIAGNOSIS — N184 Chronic kidney disease, stage 4 (severe): Secondary | ICD-10-CM

## 2011-09-10 DIAGNOSIS — Z4931 Encounter for adequacy testing for hemodialysis: Secondary | ICD-10-CM

## 2011-09-10 NOTE — Progress Notes (Signed)
[facility] [place]   Patient name: Mostafa R Wendell MRN: [id number] DOB: 10/15/1970 Sex: male     Chief Complaint  Patient presents with  . Follow-up    s/p 05-18-2011 left radiocephalic AVF placement    HISTORY OF PRESENT ILLNESS: The patient is back today for followup of his left radiocephalic fistula which was created on 05/18/2011. He is not yet on dialysis. He suffers from polycystic kidney disease. He denies swelling in the arm or symptoms of steal. He also suffers from diabetes hypertension and hyperlipidemia as well as obesity.  Past Medical History  Diagnosis Date  . Polycystic kidney disease   . Arthritis     gout  . Diabetes mellitus   . Obesity   . Hypertension   . Hyperlipidemia   . GERD (gastroesophageal reflux disease)   . Bronchitis     hx of    Past Surgical History  Procedure Date  . Bunionectomy     as a teenager  . Knee arthroscopy w/ acl reconstruction   . Av fistula placement 05/18/2011    Procedure: ARTERIOVENOUS (AV) FISTULA CREATION;  Surgeon: Jadyn Barge W Timmia Cogburn, MD;  Location: [facility] OR;  Service: Vascular;  Laterality: Left;  Left wrist fistula; ultrasound guided.    History   Social History  . Marital Status: Divorced    Spouse Name: N/A    Number of Children: N/A  . Years of Education: N/A   Occupational History  . Not on file.   Social History Main Topics  . Smoking status: Former Smoker    Types: Cigarettes    Quit date: 05/11/2011  . Smokeless tobacco: Not on file   Comment: stopped smoking approx. 1 month ago  . Alcohol Use: Yes     socially  . Drug Use: No  . Sexually Active:    Other Topics Concern  . Not on file   Social History Narrative  . No narrative on file    Family History  Problem Relation Age of Onset  . Diabetes Mother   . Hypertension Mother   . Cancer Father   . Diabetes Father   . Hypertension Father   . Diabetes Sister     Allergies as of 09/10/2011  . (No Known Allergies)     Current Outpatient Prescriptions on File Prior to Visit  Medication Sig Dispense Refill  . allopurinol (ZYLOPRIM) 300 MG tablet Take 300 mg by mouth daily.      . aspirin 81 MG tablet Take 81 mg by mouth daily.      . calcitRIOL (ROCALTROL) 0.5 MCG capsule Take 1 mcg by mouth every other day.       . calcium acetate (PHOSLO) 667 MG capsule Take 667 mg by mouth Three times a day.      . ferrous gluconate (FERGON) 246 (28 FE) MG tablet Take 27 mg by mouth daily with breakfast.      . furosemide (LASIX) 40 MG tablet Take 40 mg by mouth daily.      . glimepiride (AMARYL) 2 MG tablet Take 2 mg by mouth daily before breakfast.      . losartan (COZAAR) 100 MG tablet Take 100 mg by mouth 2 (two) times daily.      . Multiple Vitamin (MULTIVITAMIN) tablet Take 1 tablet by mouth daily.      . omeprazole (PRILOSEC) 20 MG capsule Take 20 mg by mouth daily.      . potassium chloride SA (K-DUR,KLOR-CON) 20 MEQ   tablet Take 20 mEq by mouth daily.      . simvastatin (ZOCOR) 20 MG tablet Take 20 mg by mouth every evening.      . telmisartan (MICARDIS) 80 MG tablet Take 80 mg by mouth daily.         REVIEW OF SYSTEMS: No change from his initial visit  PHYSICAL EXAMINATION:   Vital signs are BP 117/70  Pulse 89  Resp 18  Ht 6' 1" (1.854 m)  Wt 285 lb 4.8 oz (129.411 kg)  BMI 37.64 kg/m2 General: The patient appears their stated age. HEENT:  No gross abnormalities Pulmonary:  Non labored breathing Musculoskeletal: There are no major deformities. Neurologic: No focal weakness or paresthesias are detected, Skin: There are no ulcer or rashes noted. Psychiatric: The patient has normal affect. Cardiovascular: Good thrill within his fistula up to the mid forearm. The vein has matured nicely   Diagnostic Studies Duplex ultrasound today continues to show a Caliber vein measuring 0.5-0.7 cm. The depth ranges from 0.2-0.3. There is still a large branch in the mid forearm which is measuring  approximately 0.46 cm  Assessment: Chronic kidney disease with a non-maturing fistula Plan: Although the patient's fistula continues to grow nicely I think it is limited by the large branch in the mid forearm. I have recommended ligation of the branch. The patient does have 2 prominent veins on his forearm. The branch is the lateralmost vein. The cephalic vein does course medially into the knee it antecubital crease. I recommended ligation of the branch. The patient is amenable to this. He is going to contact me with the best available date for his schedule  V. Wells Tacori Kvamme IV, M.D. [facility] [place] Office: [phone] Pager:  [phone]   

## 2011-09-11 ENCOUNTER — Other Ambulatory Visit: Payer: Self-pay | Admitting: *Deleted

## 2011-09-27 ENCOUNTER — Encounter (HOSPITAL_COMMUNITY): Payer: Self-pay | Admitting: Pharmacy Technician

## 2011-10-04 ENCOUNTER — Encounter (HOSPITAL_COMMUNITY): Payer: Self-pay | Admitting: *Deleted

## 2011-10-04 MED ORDER — DEXTROSE 5 % IV SOLN
1.5000 g | INTRAVENOUS | Status: AC
  Administered 2011-10-05: 1.5 g via INTRAVENOUS
  Filled 2011-10-04: qty 1.5

## 2011-10-05 ENCOUNTER — Ambulatory Visit (HOSPITAL_COMMUNITY): Payer: Managed Care, Other (non HMO) | Admitting: Anesthesiology

## 2011-10-05 ENCOUNTER — Encounter (HOSPITAL_COMMUNITY): Payer: Self-pay | Admitting: Anesthesiology

## 2011-10-05 ENCOUNTER — Encounter (HOSPITAL_COMMUNITY)
Admission: RE | Disposition: A | Discharge status: Home / Self Care | Payer: Self-pay | Source: Ambulatory Visit | Attending: Surgery

## 2011-10-05 ENCOUNTER — Ambulatory Visit (HOSPITAL_COMMUNITY)
Admission: RE | Admit: 2011-10-05 | Discharge: 2011-10-05 | Disposition: A | Discharge status: Home / Self Care | Payer: Managed Care, Other (non HMO) | Source: Ambulatory Visit | Attending: Surgery | Admitting: Surgery

## 2011-10-05 ENCOUNTER — Encounter (HOSPITAL_COMMUNITY): Payer: Self-pay | Admitting: *Deleted

## 2011-10-05 ENCOUNTER — Telehealth: Payer: Self-pay | Admitting: Surgery

## 2011-10-05 DIAGNOSIS — Y849 Medical procedure, unspecified as the cause of abnormal reaction of the patient, or of later complication, without mention of misadventure at the time of the procedure: Secondary | ICD-10-CM | POA: Insufficient documentation

## 2011-10-05 DIAGNOSIS — K219 Gastro-esophageal reflux disease without esophagitis: Secondary | ICD-10-CM | POA: Insufficient documentation

## 2011-10-05 DIAGNOSIS — T82898A Other specified complication of vascular prosthetic devices, implants and grafts, initial encounter: Secondary | ICD-10-CM

## 2011-10-05 DIAGNOSIS — I10 Essential (primary) hypertension: Secondary | ICD-10-CM | POA: Insufficient documentation

## 2011-10-05 DIAGNOSIS — Q613 Polycystic kidney, unspecified: Secondary | ICD-10-CM | POA: Insufficient documentation

## 2011-10-05 DIAGNOSIS — E785 Hyperlipidemia, unspecified: Secondary | ICD-10-CM | POA: Insufficient documentation

## 2011-10-05 DIAGNOSIS — T82598A Other mechanical complication of other cardiac and vascular devices and implants, initial encounter: Secondary | ICD-10-CM | POA: Insufficient documentation

## 2011-10-05 DIAGNOSIS — E119 Type 2 diabetes mellitus without complications: Secondary | ICD-10-CM | POA: Insufficient documentation

## 2011-10-05 DIAGNOSIS — N186 End stage renal disease: Secondary | ICD-10-CM

## 2011-10-05 DIAGNOSIS — E669 Obesity, unspecified: Secondary | ICD-10-CM | POA: Insufficient documentation

## 2011-10-05 HISTORY — DX: Gout, unspecified: M10.9

## 2011-10-05 LAB — POCT I-STAT 4, (NA,K, GLUC, HGB,HCT)
Glucose, Bld: 169 mg/dL — ABNORMAL HIGH (ref 70–99)
Hemoglobin: 10.5 g/dL — ABNORMAL LOW (ref 13.0–17.0)
Potassium: 4.3 mEq/L (ref 3.5–5.1)
Sodium: 137 mEq/L (ref 135–145)

## 2011-10-05 LAB — GLUCOSE, CAPILLARY

## 2011-10-05 SURGERY — LIGATION OF COMPETING BRANCHES OF ARTERIOVENOUS FISTULA
Anesthesia: Monitor Anesthesia Care | Site: Arm Lower | Laterality: Left | Wound class: Clean

## 2011-10-05 MED ORDER — OXYCODONE-ACETAMINOPHEN 5-325 MG PO TABS: 1.0000 | ORAL_TABLET | ORAL | Status: AC | PRN

## 2011-10-05 MED ORDER — MUPIROCIN 2 % EX OINT
TOPICAL_OINTMENT | CUTANEOUS | Status: AC
  Administered 2011-10-05: 1 via NASAL
  Filled 2011-10-05: qty 22

## 2011-10-05 MED ORDER — LIDOCAINE HCL (CARDIAC) 20 MG/ML IV SOLN
INTRAVENOUS | Status: DC | PRN
  Administered 2011-10-05: 50 mg via INTRAVENOUS

## 2011-10-05 MED ORDER — PROPOFOL INFUSION 10 MG/ML OPTIME
INTRAVENOUS | Status: DC | PRN
  Administered 2011-10-05: 50 ug/kg/min via INTRAVENOUS

## 2011-10-05 MED ORDER — ONDANSETRON HCL 4 MG/2ML IJ SOLN: 4.0000 mg | Freq: Four times a day (QID) | INTRAMUSCULAR | Status: DC | PRN

## 2011-10-05 MED ORDER — MIDAZOLAM HCL 5 MG/5ML IJ SOLN
INTRAMUSCULAR | Status: DC | PRN
  Administered 2011-10-05: 2 mg via INTRAVENOUS

## 2011-10-05 MED ORDER — FENTANYL CITRATE 0.05 MG/ML IJ SOLN: 25.0000 ug | INTRAMUSCULAR | Status: DC | PRN

## 2011-10-05 MED ORDER — SODIUM CHLORIDE 0.9 % IV SOLN
INTRAVENOUS | Status: DC | PRN
  Administered 2011-10-05: 08:00:00 via INTRAVENOUS

## 2011-10-05 MED ORDER — FENTANYL CITRATE 0.05 MG/ML IJ SOLN
INTRAMUSCULAR | Status: DC | PRN
  Administered 2011-10-05: 25 ug via INTRAVENOUS
  Administered 2011-10-05: 100 ug via INTRAVENOUS

## 2011-10-05 MED ORDER — PROPOFOL 10 MG/ML IV BOLUS
INTRAVENOUS | Status: DC | PRN
  Administered 2011-10-05: 30 mg via INTRAVENOUS

## 2011-10-05 MED ORDER — MUPIROCIN 2 % EX OINT
TOPICAL_OINTMENT | Freq: Once | CUTANEOUS | Status: AC
  Administered 2011-10-05: 1 via NASAL
  Filled 2011-10-05: qty 22

## 2011-10-05 MED ORDER — 0.9 % SODIUM CHLORIDE (POUR BTL) OPTIME
TOPICAL | Status: DC | PRN
  Administered 2011-10-05: 1000 mL

## 2011-10-05 MED ORDER — LIDOCAINE-EPINEPHRINE (PF) 1 %-1:200000 IJ SOLN
INTRAMUSCULAR | Status: DC | PRN
  Administered 2011-10-05: 2 mL

## 2011-10-05 MED ORDER — LIDOCAINE-EPINEPHRINE (PF) 1 %-1:200000 IJ SOLN
INTRAMUSCULAR | Status: AC
  Filled 2011-10-05: qty 10

## 2011-10-05 MED ORDER — SODIUM CHLORIDE 0.9 % IV SOLN: INTRAVENOUS | Status: DC

## 2011-10-05 SURGICAL SUPPLY — 40 items
ADH SKN CLS APL DERMABOND .7 (GAUZE/BANDAGES/DRESSINGS) ×1
CANISTER SUCTION 2500CC (MISCELLANEOUS) ×2 IMPLANT
CLIP TI MEDIUM 6 (CLIP) ×2 IMPLANT
CLIP TI WIDE RED SMALL 6 (CLIP) ×2 IMPLANT
CLOTH BEACON ORANGE TIMEOUT ST (SAFETY) ×2 IMPLANT
COVER SURGICAL LIGHT HANDLE (MISCELLANEOUS) ×2 IMPLANT
DERMABOND ADVANCED (GAUZE/BANDAGES/DRESSINGS) ×1
DERMABOND ADVANCED .7 DNX12 (GAUZE/BANDAGES/DRESSINGS) ×1 IMPLANT
ELECT REM PT RETURN 9FT ADLT (ELECTROSURGICAL) ×2
ELECTRODE REM PT RTRN 9FT ADLT (ELECTROSURGICAL) ×1 IMPLANT
GEL ULTRASOUND 20GR AQUASONIC (MISCELLANEOUS) IMPLANT
GLOVE BIOGEL PI IND STRL 6.5 (GLOVE) IMPLANT
GLOVE BIOGEL PI IND STRL 7.5 (GLOVE) ×1 IMPLANT
GLOVE BIOGEL PI INDICATOR 6.5 (GLOVE) ×1
GLOVE BIOGEL PI INDICATOR 7.5 (GLOVE) ×1
GLOVE SURG SS PI 6.5 STRL IVOR (GLOVE) ×1 IMPLANT
GLOVE SURG SS PI 7.5 STRL IVOR (GLOVE) ×2 IMPLANT
GOWN PREVENTION PLUS XXLARGE (GOWN DISPOSABLE) ×2 IMPLANT
GOWN STRL NON-REIN LRG LVL3 (GOWN DISPOSABLE) ×3 IMPLANT
HEMOSTAT SNOW SURGICEL 2X4 (HEMOSTASIS) IMPLANT
HEMOSTAT SURGICEL 2X14 (HEMOSTASIS) IMPLANT
KIT BASIN OR (CUSTOM PROCEDURE TRAY) ×2 IMPLANT
KIT ROOM TURNOVER OR (KITS) ×2 IMPLANT
NS IRRIG 1000ML POUR BTL (IV SOLUTION) ×2 IMPLANT
PACK CV ACCESS (CUSTOM PROCEDURE TRAY) ×2 IMPLANT
PAD ARMBOARD 7.5X6 YLW CONV (MISCELLANEOUS) ×4 IMPLANT
PENCIL BUTTON HOLSTER BLD 10FT (ELECTRODE) ×1 IMPLANT
STAPLER VISISTAT 35W (STAPLE) IMPLANT
SUT ETHILON 3 0 PS 1 (SUTURE) IMPLANT
SUT PROLENE 6 0 BV (SUTURE) IMPLANT
SUT SILK 0 TIES 10X30 (SUTURE) ×2 IMPLANT
SUT VIC AB 3-0 SH 27 (SUTURE) ×2
SUT VIC AB 3-0 SH 27X BRD (SUTURE) ×1 IMPLANT
SUT VICRYL 4-0 PS2 18IN ABS (SUTURE) ×1 IMPLANT
SWAB COLLECTION DEVICE MRSA (MISCELLANEOUS) IMPLANT
TOWEL OR 17X24 6PK STRL BLUE (TOWEL DISPOSABLE) ×2 IMPLANT
TOWEL OR 17X26 10 PK STRL BLUE (TOWEL DISPOSABLE) ×2 IMPLANT
TUBE ANAEROBIC SPECIMEN COL (MISCELLANEOUS) IMPLANT
UNDERPAD 30X30 INCONTINENT (UNDERPADS AND DIAPERS) ×2 IMPLANT
WATER STERILE IRR 1000ML POUR (IV SOLUTION) ×2 IMPLANT

## 2011-10-05 NOTE — Anesthesia Postprocedure Evaluation (Signed)
Anesthesia Post Note  Patient: Harold Freeman  Procedure(s) Performed: Procedure(s) (LRB): LIGATION OF COMPETING BRANCHES OF ARTERIOVENOUS FISTULA (Left)  Anesthesia type: MAC  Patient location: PACU  Post pain: Pain level controlled and Adequate analgesia  Post assessment: Post-op Vital signs reviewed, Patient's Cardiovascular Status Stable and Respiratory Function Stable  Last Vitals:  Filed Vitals:   10/05/11 0830  BP: 123/72  Pulse: 68  Temp:   Resp: 25    Post vital signs: Reviewed and stable  Level of consciousness: awake, alert  and oriented  Complications: No apparent anesthesia complications

## 2011-10-05 NOTE — Progress Notes (Signed)
Pt. Removed contacts prior to leaving to holding.

## 2011-10-05 NOTE — Progress Notes (Signed)
cbg 169 at [phone]

## 2011-10-05 NOTE — H&P (View-Only) (Signed)
[facility] [place]   Patient name: Harold Freeman MRN: [id number] DOB: 1970/10/15 Sex: male     Chief Complaint  Patient presents with  . Follow-up    s/p 05-18-2011 left radiocephalic AVF placement    HISTORY OF PRESENT ILLNESS: The patient is back today for followup of his left radiocephalic fistula which was created on 05/18/2011. He is not yet on dialysis. He suffers from polycystic kidney disease. He denies swelling in the arm or symptoms of steal. He also suffers from diabetes hypertension and hyperlipidemia as well as obesity.  Past Medical History  Diagnosis Date  . Polycystic kidney disease   . Arthritis     gout  . Diabetes mellitus   . Obesity   . Hypertension   . Hyperlipidemia   . GERD (gastroesophageal reflux disease)   . Bronchitis     hx of    Past Surgical History  Procedure Date  . Bunionectomy     as a teenager  . Knee arthroscopy w/ acl reconstruction   . Av fistula placement 05/18/2011    Procedure: ARTERIOVENOUS (AV) FISTULA CREATION;  Surgeon: Nada Libman, MD;  Location: [facility] OR;  Service: Vascular;  Laterality: Left;  Left wrist fistula; ultrasound guided.    History   Social History  . Marital Status: Divorced    Spouse Name: N/A    Number of Children: N/A  . Years of Education: N/A   Occupational History  . Not on file.   Social History Main Topics  . Smoking status: Former Smoker    Types: Cigarettes    Quit date: 05/11/2011  . Smokeless tobacco: Not on file   Comment: stopped smoking approx. 1 month ago  . Alcohol Use: Yes     socially  . Drug Use: No  . Sexually Active:    Other Topics Concern  . Not on file   Social History Narrative  . No narrative on file    Family History  Problem Relation Age of Onset  . Diabetes Mother   . Hypertension Mother   . Cancer Father   . Diabetes Father   . Hypertension Father   . Diabetes Sister     Allergies as of 09/10/2011  . (No Known Allergies)     Current Outpatient Prescriptions on File Prior to Visit  Medication Sig Dispense Refill  . allopurinol (ZYLOPRIM) 300 MG tablet Take 300 mg by mouth daily.      Marland Kitchen aspirin 81 MG tablet Take 81 mg by mouth daily.      . calcitRIOL (ROCALTROL) 0.5 MCG capsule Take 1 mcg by mouth every other day.       . calcium acetate (PHOSLO) 667 MG capsule Take 667 mg by mouth Three times a day.      . ferrous gluconate (FERGON) 246 (28 FE) MG tablet Take 27 mg by mouth daily with breakfast.      . furosemide (LASIX) 40 MG tablet Take 40 mg by mouth daily.      Marland Kitchen glimepiride (AMARYL) 2 MG tablet Take 2 mg by mouth daily before breakfast.      . losartan (COZAAR) 100 MG tablet Take 100 mg by mouth 2 (two) times daily.      . Multiple Vitamin (MULTIVITAMIN) tablet Take 1 tablet by mouth daily.      Marland Kitchen omeprazole (PRILOSEC) 20 MG capsule Take 20 mg by mouth daily.      . potassium chloride SA (K-DUR,KLOR-CON) 20 MEQ  tablet Take 20 mEq by mouth daily.      . simvastatin (ZOCOR) 20 MG tablet Take 20 mg by mouth every evening.      Marland Kitchen telmisartan (MICARDIS) 80 MG tablet Take 80 mg by mouth daily.         REVIEW OF SYSTEMS: No change from his initial visit  PHYSICAL EXAMINATION:   Vital signs are BP 117/70  Pulse 89  Resp 18  Ht 6\' 1"  (1.854 m)  Wt 285 lb 4.8 oz (129.411 kg)  BMI 37.64 kg/m2 General: The patient appears their stated age. HEENT:  No gross abnormalities Pulmonary:  Non labored breathing Musculoskeletal: There are no major deformities. Neurologic: No focal weakness or paresthesias are detected, Skin: There are no ulcer or rashes noted. Psychiatric: The patient has normal affect. Cardiovascular: Good thrill within his fistula up to the mid forearm. The vein has matured nicely   Diagnostic Studies Duplex ultrasound today continues to show a Caliber vein measuring 0.5-0.7 cm. The depth ranges from 0.2-0.3. There is still a large branch in the mid forearm which is measuring  approximately 0.46 cm  Assessment: Chronic kidney disease with a non-maturing fistula Plan: Although the patient's fistula continues to grow nicely I think it is limited by the large branch in the mid forearm. I have recommended ligation of the branch. The patient does have 2 prominent veins on his forearm. The branch is the lateralmost vein. The cephalic vein does course medially into the knee it antecubital crease. I recommended ligation of the branch. The patient is amenable to this. He is going to contact me with the best available date for his schedule  V. Charlena Cross, M.D. [facility] [place] Office: [phone] Pager:  [phone]

## 2011-10-05 NOTE — Interval H&P Note (Signed)
History and Physical Interval Note:  10/05/2011 7:33 AM  Harold Freeman  has presented today for surgery, with the diagnosis of ESRD  The various methods of treatment have been discussed with the patient and family. After consideration of risks, benefits and other options for treatment, the patient has consented to  Procedure(s) (LRB) with comments: LIGATION OF COMPETING BRANCHES OF ARTERIOVENOUS FISTULA (Left) as a surgical intervention .  The patient's history has been reviewed, patient examined, no change in status, stable for surgery.  I have reviewed the patient's chart and labs.  Questions were answered to the patient's satisfaction.     Maryse Brierley IV, V. WELLS

## 2011-10-05 NOTE — Anesthesia Preprocedure Evaluation (Addendum)
Anesthesia Evaluation  Patient identified by MRN, date of birth, ID band Patient awake    Reviewed: Allergy & Precautions, H&P , NPO status , Patient's Chart, lab work & pertinent test results, reviewed documented beta blocker date and time   Airway Mallampati: II  Neck ROM: full    Dental  (+) Teeth Intact and Dental Advisory Given   Pulmonary          Cardiovascular hypertension, Pt. on medications     Neuro/Psych    GI/Hepatic GERD-  Medicated,  Endo/Other  diabetes, Type 2, Oral Hypoglycemic AgentsMorbid obesity  Renal/GU ESRFRenal disease     Musculoskeletal   Abdominal (+) + obese,   Peds  Hematology   Anesthesia Other Findings   Reproductive/Obstetrics                          Anesthesia Physical Anesthesia Plan  ASA: III  Anesthesia Plan: MAC   Post-op Pain Management:    Induction: Intravenous  Airway Management Planned: Simple Face Mask  Additional Equipment:   Intra-op Plan:   Post-operative Plan:   Informed Consent: I have reviewed the patients History and Physical, chart, labs and discussed the procedure including the risks, benefits and alternatives for the proposed anesthesia with the patient or authorized representative who has indicated his/her understanding and acceptance.     Plan Discussed with: CRNA and Surgeon  Anesthesia Plan Comments:         Anesthesia Quick Evaluation

## 2011-10-05 NOTE — Telephone Encounter (Signed)
This is on Trevian Hayashida #[id number]   ----- Message -----   From: Nada Libman, MD   Sent: 10/05/2011 8:23 AM   To: Reuel Derby, Melene Plan, RN      10/05/2011:      Assistants: None   Procedure: Branch ligation left wrist fistula         Needs follow up in 4 weeks.   I scheduled an appt for the patient on 10/29/11 at 12:15pm w/ [facility]. I left the pt a voicemail message and also mailed an appt letter. awt

## 2011-10-05 NOTE — Transfer of Care (Signed)
Immediate Anesthesia Transfer of Care Note  Patient: Harold Freeman  Procedure(s) Performed: Procedure(s) (LRB) with comments: LIGATION OF COMPETING BRANCHES OF ARTERIOVENOUS FISTULA (Left)  Patient Location: PACU  Anesthesia Type: MAC  Level of Consciousness: awake, alert  and oriented  Airway & Oxygen Therapy: Patient Spontanous Breathing  Post-op Assessment: Report given to PACU RN and Post -op Vital signs reviewed and stable  Post vital signs: Reviewed  Complications: No apparent anesthesia complications

## 2011-10-05 NOTE — Op Note (Signed)
[facility] [place]  Patient name: Harold Freeman MRN: [id number] DOB: 15-Dec-1970 Sex: male  10/05/2011 Pre-operative Diagnosis: non-maturing left radio-cephalic fistula Post-operative diagnosis:  Same Surgeon:  Jorge Ny Assistants:  None Procedure:   Branch ligation left wrist fistula Anesthesia:  MAC Blood Loss:  See anesthesia record Specimens:  none  Findings:  Large branch, improved thrill after ligation  Procedure:  The patient was identified in the holding area and taken to [facility] OR ROOM 12  The patient was then placed supine on the table. MAC anesthesia was administered.  The patient was prepped and draped in the usual sterile fashion.  A time out was called and antibiotics were administered.  U/S was used to confirm the correct location of the branch.  1% lidocaine was used.  A skin incision was made and the branch was located sharply with scissors.  It was circumferentially exposed.  I placed 2 2-0 silk ties around the branch at its origin.  There was an improved thrill in the fistula after ligation.  The tissue was then closed with 3-0 vicryl and Dermabond was [placed.   Disposition:  To PACU in stable condition.   Juleen China, M.D. [facility] [place] Office: [phone] Pager:  [phone]

## 2011-10-05 NOTE — Preoperative (Signed)
Beta Blockers   Reason not to administer Beta Blockers:Not Applicable. No home beta blockers 

## 2011-10-29 ENCOUNTER — Ambulatory Visit: Payer: Managed Care, Other (non HMO) | Admitting: Surgery

## 2011-11-20 ENCOUNTER — Telehealth (INDEPENDENT_AMBULATORY_CARE_PROVIDER_SITE_OTHER): Payer: Self-pay

## 2011-11-20 NOTE — Telephone Encounter (Signed)
Left message for patient to call our office re: New Bariatric appointment on 11/30/11 @ 3:30 pm w/Dr. Johna Sheriff.  Patient has attended Bariatric Seminar.

## 2011-11-20 NOTE — Telephone Encounter (Signed)
Danielle called for the pt to refer him for weight loss surgery by Dr Johna Sheriff.  They want him seen soon.  He needs a kidney transplant.  I told her he just needs to go to the seminar and complete his packet and it's not usually too far out that they get scheduled.  She called back and informed me the pt has been to the seminar.  I then transferred her call to Katie's vm to have her call and help with an appointment.

## 2011-11-21 ENCOUNTER — Telehealth (INDEPENDENT_AMBULATORY_CARE_PROVIDER_SITE_OTHER): Payer: Self-pay

## 2011-11-21 NOTE — Telephone Encounter (Signed)
Left message for patient to contact our office re: Patient has been scheduled for Weight Loss Surgery Consultation on 11/30/11 @ 3:30pm w/Dr. Johna Sheriff.

## 2011-11-23 ENCOUNTER — Encounter: Payer: Self-pay | Admitting: Surgery

## 2011-11-26 ENCOUNTER — Ambulatory Visit (INDEPENDENT_AMBULATORY_CARE_PROVIDER_SITE_OTHER): Payer: Managed Care, Other (non HMO) | Admitting: Surgery

## 2011-11-26 ENCOUNTER — Encounter: Payer: Self-pay | Admitting: Surgery

## 2011-11-26 VITALS — BP 142/83 | HR 99 | Ht 73.0 in | Wt 284.0 lb

## 2011-11-26 DIAGNOSIS — N186 End stage renal disease: Secondary | ICD-10-CM

## 2011-11-26 NOTE — Progress Notes (Signed)
The patient is previously undergone left radiocephalic fistula. On 913 he underwent branch ligation of a large branch. He is back today for further followup. He states that he has noticed that the pain has become more prominent up to the elbow. He denies having any symptoms of a steal syndrome.  On examination: There is a palpable thrill within the vein well above the antecubital crease. The vein has dilated nicely. It does take a somewhat tortuous course in the forearm. There is a sharp turn around the vein branch ligation incision however I do not feel that this is compromising blood flow.  At this point I feel that this fistula is ready for access.  I will have him followup on a when necessary basis

## 2011-11-30 ENCOUNTER — Encounter (INDEPENDENT_AMBULATORY_CARE_PROVIDER_SITE_OTHER): Payer: Self-pay | Admitting: General Surgery

## 2011-11-30 ENCOUNTER — Ambulatory Visit (INDEPENDENT_AMBULATORY_CARE_PROVIDER_SITE_OTHER): Payer: Managed Care, Other (non HMO) | Admitting: General Surgery

## 2011-11-30 DIAGNOSIS — N186 End stage renal disease: Secondary | ICD-10-CM

## 2011-11-30 DIAGNOSIS — I10 Essential (primary) hypertension: Secondary | ICD-10-CM

## 2011-11-30 DIAGNOSIS — Z6837 Body mass index (BMI) 37.0-37.9, adult: Secondary | ICD-10-CM

## 2011-11-30 NOTE — Progress Notes (Signed)
Subjective:   obesity  Patient ID: Harold Freeman, male   DOB: 1970/10/28, 41 y.o.   MRN: [id number]  HPI Patient is a pleasant 41 year old male referred by Dr. Chilton Si and Dr. Darrick Penna for consideration for surgical treatment of morbid obesity. The patient has been overweight since adolescence. He has been unable to lose a significant amount of weight loss per despite multiple efforts. He has a family history and personal history of polycystic kidney disease and now has end-stage renal disease and impending need for dialysis. He would be a good candidate for renal transplant except for his obesity which would preclude transplant at his current weight. Due to concerns about going forward at his current weight in regards to his health and in order to successfully complete a transplant he is motivated to pursue surgical weight loss. He has been to our initial information seminar. He is interested in band or sleeves due to the less invasive nature.  Past Medical History  Diagnosis Date  . Diabetes mellitus   . Obesity   . Hypertension   . Hyperlipidemia   . GERD (gastroesophageal reflux disease)   . Bronchitis     hx of  . Gout   . Polycystic kidney disease     ESRD on dialysis   Past Surgical History  Procedure Date  . Bunionectomy     as a teenager  . Knee arthroscopy w/ acl reconstruction   . Av fistula placement 05/18/2011    Procedure: ARTERIOVENOUS (AV) FISTULA CREATION;  Surgeon: Nada Libman, MD;  Location: [facility] OR;  Service: Vascular;  Laterality: Left;  Left wrist fistula; ultrasound guided.   Current Outpatient Prescriptions  Medication Sig Dispense Refill  . allopurinol (ZYLOPRIM) 300 MG tablet Take 300 mg by mouth daily.      Marland Kitchen aspirin 81 MG tablet Take 81 mg by mouth daily.      . calcitRIOL (ROCALTROL) 0.5 MCG capsule Take 1 mcg by mouth. .5 mcg every other day then (2) .5 mcg tablets every other day      . calcium acetate (PHOSLO) 667 MG capsule Take 667 mg by mouth 3 (three)  times daily with meals.       . ferrous gluconate (FERGON) 246 (28 FE) MG tablet Take 27 mg by mouth daily with breakfast.      . furosemide (LASIX) 40 MG tablet Take 40 mg by mouth daily.      Marland Kitchen glimepiride (AMARYL) 2 MG tablet Take 2 mg by mouth daily before breakfast.      . losartan (COZAAR) 100 MG tablet Take 100 mg by mouth 2 (two) times daily.      . Multiple Vitamin (MULTIVITAMIN) tablet Take 1 tablet by mouth daily.      Marland Kitchen omeprazole (PRILOSEC) 20 MG capsule Take 20 mg by mouth daily.      . potassium chloride SA (K-DUR,KLOR-CON) 20 MEQ tablet Take 20 mEq by mouth daily.      . simvastatin (ZOCOR) 20 MG tablet Take 20 mg by mouth every evening.      Marland Kitchen telmisartan (MICARDIS) 80 MG tablet Take 80 mg by mouth daily.       No Known Allergies History  Substance Use Topics  . Smoking status: Former Smoker    Types: Cigarettes    Quit date: 05/11/2011  . Smokeless tobacco: Not on file     Comment: stopped smoking approx. 1 month ago  . Alcohol Use: Yes     Comment: socially  Family History  Problem Relation Age of Onset  . Diabetes Mother   . Hypertension Mother   . Cancer Father   . Diabetes Father   . Hypertension Father   . Diabetes Sister      Review of Systems  HENT: Negative.   Respiratory: Negative.   Cardiovascular: Negative.   Gastrointestinal: Negative.        Objective:   Physical Exam BP 146/72  Pulse 88  Temp 97.1 F (36.2 C) (Temporal)  Resp 20  Ht 6\' 1"  (1.854 m)  Wt 281 lb 9.6 oz (127.733 kg)  BMI 37.15 kg/m2 General: Alert, Obese Caucasian male, in no distress Skin: Warm and dry without rash or infection. HEENT: No palpable masses or thyromegaly. Sclera nonicteric. Pupils equal round and reactive. Oropharynx clear. Lymph nodes: No cervical, supraclavicular, or inguinal nodes palpable. Lungs: Breath sounds clear and equal without increased work of breathing Cardiovascular: Regular rate and rhythm without murmur. No JVD or edema. Peripheral  pulses intact. Abdomen: Nondistended. Soft and nontender. No masses palpable. No organomegaly. No palpable hernias. Extremities: No edema or joint swelling or deformity. No chronic venous stasis changes. Functioning fistula in the left forearm Neurologic: Alert and fully oriented. Gait normal.     Assessment:     Morbid obesity with comorbidities of hypertension, diabetes, elevated cholesterol. He has polycystic kidney disease with impending dialysis and will be unable to undergo a transplant without significant weight loss. For these reasons I believe he would be an excellent candidate for surgical treatment of his weight loss. We discussed surgical options available including bypass, sleeve and a band, he prefers a less invasive operation and particularly as he will hopefully be undergoing transplant in beyond immunosuppressive medications I think the least invasive and potentially adjustable and reversible operation makes the most sense. I have recommended lap band for this reason. We discussed pros and cons of all the operations.  We have discussed the nature of morbid obesity and the risk of remaining obese. We discussed the indications for the procedure, its nature, and expected recovery. The risks of the procedure were discussed in detail including anesthetic complications, bleeding, infection, visceral injury, dysphagia, and long-term risks of mechanical failure, slippage, erosion, esophageal dilatation and failure to lose weight. Rare risk of death was discussed. The patient was given a complete consent form and all questions were answered. We will proceed with preoperative workup including routine lab and x-rays, psychologic and nutrition evaluations, and upper GI series.  I will see the patient back following this evaluation.         Plan:     Proceed preoperative workup and planning for lap band surgery as above.

## 2011-12-03 LAB — COMPREHENSIVE METABOLIC PANEL
AST: 17 U/L (ref 0–37)
Albumin: 4 g/dL (ref 3.5–5.2)
Alkaline Phosphatase: 81 U/L (ref 39–117)
Potassium: 4.1 mEq/L (ref 3.5–5.3)
Sodium: 136 mEq/L (ref 135–145)
Total Protein: 7.3 g/dL (ref 6.0–8.3)

## 2011-12-03 LAB — CBC WITH DIFFERENTIAL/PLATELET
Basophils Absolute: 0.1 10*3/uL (ref 0.0–0.1)
Basophils Relative: 1 % (ref 0–1)
Hemoglobin: 10.4 g/dL — ABNORMAL LOW (ref 13.0–17.0)
MCHC: 34.3 g/dL (ref 30.0–36.0)
Neutro Abs: 6.2 10*3/uL (ref 1.7–7.7)
Neutrophils Relative %: 69 % (ref 43–77)
RDW: 15.8 % — ABNORMAL HIGH (ref 11.5–15.5)

## 2011-12-03 LAB — LIPID PANEL
HDL: 24 mg/dL — ABNORMAL LOW (ref >39)
Triglycerides: 775 mg/dL — ABNORMAL HIGH (ref <150)

## 2011-12-03 LAB — HEMOGLOBIN A1C: Hgb A1c MFr Bld: 6.8 % — ABNORMAL HIGH (ref <5.7)

## 2011-12-12 ENCOUNTER — Ambulatory Visit (HOSPITAL_COMMUNITY)
Admission: RE | Admit: 2011-12-12 | Discharge: 2011-12-12 | Disposition: A | Discharge status: Home / Self Care | Payer: Managed Care, Other (non HMO) | Source: Ambulatory Visit | Attending: General Surgery | Admitting: General Surgery

## 2011-12-12 DIAGNOSIS — Z6837 Body mass index (BMI) 37.0-37.9, adult: Secondary | ICD-10-CM | POA: Insufficient documentation

## 2011-12-12 DIAGNOSIS — N186 End stage renal disease: Secondary | ICD-10-CM

## 2011-12-12 DIAGNOSIS — K219 Gastro-esophageal reflux disease without esophagitis: Secondary | ICD-10-CM | POA: Insufficient documentation

## 2011-12-12 DIAGNOSIS — M109 Gout, unspecified: Secondary | ICD-10-CM | POA: Insufficient documentation

## 2011-12-12 DIAGNOSIS — E119 Type 2 diabetes mellitus without complications: Secondary | ICD-10-CM | POA: Insufficient documentation

## 2011-12-12 DIAGNOSIS — Q613 Polycystic kidney, unspecified: Secondary | ICD-10-CM | POA: Insufficient documentation

## 2011-12-12 DIAGNOSIS — E7800 Pure hypercholesterolemia, unspecified: Secondary | ICD-10-CM | POA: Insufficient documentation

## 2011-12-12 DIAGNOSIS — I10 Essential (primary) hypertension: Secondary | ICD-10-CM

## 2011-12-24 ENCOUNTER — Encounter: Payer: Managed Care, Other (non HMO) | Attending: General Surgery | Admitting: *Deleted

## 2011-12-24 ENCOUNTER — Encounter: Payer: Self-pay | Admitting: *Deleted

## 2011-12-24 DIAGNOSIS — Z713 Dietary counseling and surveillance: Secondary | ICD-10-CM | POA: Insufficient documentation

## 2011-12-24 DIAGNOSIS — Z01818 Encounter for other preprocedural examination: Secondary | ICD-10-CM | POA: Insufficient documentation

## 2011-12-24 NOTE — Progress Notes (Signed)
  Pre-Op Assessment Visit:  Pre-Operative LAGB Surgery  Medical Nutrition Therapy:  Appt start time: 1130   End time:  1230.  Patient was seen on 12/24/2011 for Pre-Operative LAGB Nutrition Assessment. Assessment and letter of approval faxed to [facility] Surgery Bariatric Surgery Program coordinator on 12/24/2011.  Approval letter sent to [facility] center and will be available in the chart under the media tab.  Handouts given during visit include:  Pre-Op Goals   Bariatric Surgery Protein Shakes  Patient to call for Pre-Op and Post-Op Nutrition Education at the [facility] when surgery is scheduled.

## 2011-12-24 NOTE — Patient Instructions (Signed)
   Follow Pre-Op Nutrition Goals to prepare for Lapband Surgery.   Call the [facility] at [phone] once you have been given your surgery date to enrolled in the Pre-Op Nutrition Class. You will need to attend this nutrition class 3-4 weeks prior to your surgery. 

## 2012-06-20 ENCOUNTER — Telehealth (HOSPITAL_COMMUNITY): Payer: Self-pay | Admitting: *Deleted

## 2012-06-20 ENCOUNTER — Encounter (HOSPITAL_COMMUNITY): Payer: Self-pay | Admitting: *Deleted

## 2012-06-20 ENCOUNTER — Encounter (HOSPITAL_COMMUNITY): Payer: Self-pay | Admitting: Pharmacy Technician

## 2012-06-20 NOTE — Telephone Encounter (Signed)
Per Dr Gala Romney, he states pt has had an abnormal myoview at [facility] and needs a heart cath per Dr Allena Katz, pt is a HD pt and had a cardiac work up for a kidney transplant, pt is aware and cath scheduled for 6/13 at 12:00, instructions reviewed with him via phone

## 2012-06-24 ENCOUNTER — Other Ambulatory Visit (HOSPITAL_COMMUNITY): Payer: Self-pay | Admitting: Adult Health

## 2012-06-24 DIAGNOSIS — R9439 Abnormal result of other cardiovascular function study: Secondary | ICD-10-CM

## 2012-07-04 ENCOUNTER — Encounter (HOSPITAL_COMMUNITY)
Admission: RE | Disposition: A | Discharge status: Home / Self Care | Payer: Self-pay | Source: Ambulatory Visit | Attending: Internal Medicine

## 2012-07-04 ENCOUNTER — Encounter (HOSPITAL_COMMUNITY): Payer: Self-pay | Admitting: Physician Assistant

## 2012-07-04 ENCOUNTER — Other Ambulatory Visit: Payer: Self-pay | Admitting: Physician Assistant

## 2012-07-04 ENCOUNTER — Ambulatory Visit (HOSPITAL_COMMUNITY)
Admission: RE | Admit: 2012-07-04 | Discharge: 2012-07-04 | Disposition: A | Discharge status: Home / Self Care | Payer: Managed Care, Other (non HMO) | Source: Ambulatory Visit | Attending: Internal Medicine | Admitting: Internal Medicine

## 2012-07-04 DIAGNOSIS — Q613 Polycystic kidney, unspecified: Secondary | ICD-10-CM | POA: Insufficient documentation

## 2012-07-04 DIAGNOSIS — N186 End stage renal disease: Secondary | ICD-10-CM | POA: Insufficient documentation

## 2012-07-04 DIAGNOSIS — E785 Hyperlipidemia, unspecified: Secondary | ICD-10-CM | POA: Insufficient documentation

## 2012-07-04 DIAGNOSIS — E669 Obesity, unspecified: Secondary | ICD-10-CM | POA: Insufficient documentation

## 2012-07-04 DIAGNOSIS — R9439 Abnormal result of other cardiovascular function study: Secondary | ICD-10-CM | POA: Insufficient documentation

## 2012-07-04 DIAGNOSIS — F17200 Nicotine dependence, unspecified, uncomplicated: Secondary | ICD-10-CM | POA: Insufficient documentation

## 2012-07-04 DIAGNOSIS — I120 Hypertensive chronic kidney disease with stage 5 chronic kidney disease or end stage renal disease: Secondary | ICD-10-CM | POA: Insufficient documentation

## 2012-07-04 DIAGNOSIS — I2510 Atherosclerotic heart disease of native coronary artery without angina pectoris: Secondary | ICD-10-CM

## 2012-07-04 DIAGNOSIS — I428 Other cardiomyopathies: Secondary | ICD-10-CM | POA: Insufficient documentation

## 2012-07-04 DIAGNOSIS — E119 Type 2 diabetes mellitus without complications: Secondary | ICD-10-CM | POA: Insufficient documentation

## 2012-07-04 HISTORY — PX: LEFT HEART CATHETERIZATION WITH CORONARY ANGIOGRAM: SHX5451

## 2012-07-04 HISTORY — DX: Tobacco use: Z72.0

## 2012-07-04 LAB — BASIC METABOLIC PANEL
CO2: 28 mEq/L (ref 19–32)
Calcium: 10 mg/dL (ref 8.4–10.5)
Chloride: 96 mEq/L (ref 96–112)
Creatinine, Ser: 8.7 mg/dL — ABNORMAL HIGH (ref 0.50–1.35)
Glucose, Bld: 113 mg/dL — ABNORMAL HIGH (ref 70–99)

## 2012-07-04 LAB — GLUCOSE, CAPILLARY: Glucose-Capillary: 113 mg/dL — ABNORMAL HIGH (ref 70–99)

## 2012-07-04 LAB — CBC
HCT: 32.3 % — ABNORMAL LOW (ref 39.0–52.0)
MCH: 31.8 pg (ref 26.0–34.0)
MCV: 91 fL (ref 78.0–100.0)
Platelets: 226 10*3/uL (ref 150–400)
RDW: 15.2 % (ref 11.5–15.5)

## 2012-07-04 SURGERY — LEFT HEART CATHETERIZATION WITH CORONARY ANGIOGRAM
Anesthesia: LOCAL

## 2012-07-04 MED ORDER — LIDOCAINE HCL (PF) 1 % IJ SOLN
INTRAMUSCULAR | Status: AC
  Filled 2012-07-04: qty 30

## 2012-07-04 MED ORDER — FENTANYL CITRATE 0.05 MG/ML IJ SOLN
INTRAMUSCULAR | Status: AC
  Filled 2012-07-04: qty 2

## 2012-07-04 MED ORDER — SODIUM CHLORIDE 0.9 % IJ SOLN: 3.0000 mL | Freq: Two times a day (BID) | INTRAMUSCULAR | Status: DC

## 2012-07-04 MED ORDER — SODIUM CHLORIDE 0.9 % IV SOLN: 250.0000 mL | INTRAVENOUS | Status: DC | PRN

## 2012-07-04 MED ORDER — ASPIRIN 81 MG PO CHEW
CHEWABLE_TABLET | ORAL | Status: AC
  Filled 2012-07-04: qty 4

## 2012-07-04 MED ORDER — ASPIRIN 81 MG PO CHEW
324.0000 mg | CHEWABLE_TABLET | ORAL | Status: AC
  Administered 2012-07-04: 324 mg via ORAL

## 2012-07-04 MED ORDER — SODIUM CHLORIDE 0.9 % IJ SOLN: 3.0000 mL | INTRAMUSCULAR | Status: DC | PRN

## 2012-07-04 MED ORDER — CARVEDILOL 6.25 MG PO TABS: 6.2500 mg | ORAL_TABLET | Freq: Two times a day (BID) | ORAL | Status: DC

## 2012-07-04 MED ORDER — MIDAZOLAM HCL 2 MG/2ML IJ SOLN
INTRAMUSCULAR | Status: AC
  Filled 2012-07-04: qty 2

## 2012-07-04 MED ORDER — ONDANSETRON HCL 4 MG/2ML IJ SOLN: 4.0000 mg | Freq: Four times a day (QID) | INTRAMUSCULAR | Status: DC | PRN

## 2012-07-04 MED ORDER — ACETAMINOPHEN 325 MG PO TABS: 650.0000 mg | ORAL_TABLET | ORAL | Status: DC | PRN

## 2012-07-04 MED ORDER — HEPARIN (PORCINE) IN NACL 2-0.9 UNIT/ML-% IJ SOLN
INTRAMUSCULAR | Status: AC
  Filled 2012-07-04: qty 1500

## 2012-07-04 MED ORDER — SODIUM CHLORIDE 0.9 % IV SOLN
INTRAVENOUS | Status: DC
Start: 2012-07-04 — End: 2012-07-04

## 2012-07-04 NOTE — H&P (Signed)
Referring: Jose Persia, MD [facility])  HPI:  Mr/ Harold Freeman is a 42 y/o male with ESRD due to polycystic kidney disease who is being evaluated for renal transplant. Referred by Dr. Allena Katz for pre-transplant cath due to abnormal exercise test.  PMHx: tobacco use, DM2, ESRD, obesity, HTN, HL.  No known CAD.  As part of his transplant work-up he underwent an exercise stress echocardiogram which showed an abnormal resting EF which improved with stress. He denies an cardiac symptoms. No CP, pressure or DOE. Volume managed with HD. Saw Dr. Allena Katz at [facility] and referred for pre-transplant cath to exclude significant underlying CAD.    Review of Systems:     Cardiac Review of Systems: {Y] = yes [ ]  = no  Chest Pain [    ]  Resting SOB [   ] Exertional SOB  [  ]  Orthopnea [  ]   Pedal Edema [   ]    Palpitations [  ] Syncope  [  ]   Presyncope [   ]  General Review of Systems: [Y] = yes [  ]=no Constitional: recent weight change [  ]; anorexia [  ]; fatigue [  ]; nausea [  ]; night sweats [  ]; fever [  ]; or chills [  ];                                                                                                                                           Eye : blurred vision [  ]; diplopia [   ]; vision changes [  ];  Amaurosis fugax[  ]; Resp: cough [  ];  wheezing[  ];  hemoptysis[  ]; shortness of breath[  ]; paroxysmal nocturnal dyspnea[  ]; dyspnea on exertion[  ]; or orthopnea[  ];  GI:  gallstones[  ], vomiting[  ];  dysphagia[  ]; melena[  ];  hematochezia [  ]; heartburn[  ];   Hx of  Colonoscopy[  ]; GU: kidney stones [  ]; hematuria[  ];   dysuria [  ];  nocturia[  ];  history of     obstruction [  ];                 Skin: rash, swelling[  ];, hair loss[  ];  peripheral edema[  ];  or itching[  ]; Musculosketetal: myalgias[  ];  joint swelling[  ];  joint erythema[  ];  joint pain[  ];  back pain[  ];  Heme/Lymph: bruising[  ];  bleeding[  ];  anemia[  ];  Neuro: TIA[  ];  headaches[  ];   stroke[  ];  vertigo[  ];  seizures[  ];   paresthesias[  ];  difficulty walking[  ];  Psych:depression[  ]; anxiety[  ];  Endocrine: diabetes[y  ];  thyroid dysfunction[  ];  Other:  Past Medical History  Diagnosis Date  . Diabetes mellitus   . Obesity   . Hypertension   . Hyperlipidemia   . GERD (gastroesophageal reflux disease)   . Bronchitis     hx of  . Gout   . Polycystic kidney disease     ESRD w/ fistula; waiting on dialysis    Medications Prior to Admission  Medication Sig Dispense Refill  . allopurinol (ZYLOPRIM) 300 MG tablet Take 300 mg by mouth daily.      Marland Kitchen aspirin 81 MG tablet Take 81 mg by mouth daily.      . calcium acetate (PHOSLO) 667 MG capsule Take 667 mg by mouth 3 (three) times daily with meals.       Marland Kitchen glimepiride (AMARYL) 4 MG tablet Take 4 mg by mouth daily before breakfast.      . losartan (COZAAR) 100 MG tablet Take 100 mg by mouth 2 (two) times daily.      . multivitamin (RENA-VIT) TABS tablet Take 1 tablet by mouth daily.      Marland Kitchen omeprazole (PRILOSEC) 20 MG capsule Take 20 mg by mouth daily.      . simvastatin (ZOCOR) 20 MG tablet Take 20 mg by mouth every evening.         No Known Allergies  History   Social History  . Marital Status: Single    Spouse Name: N/A    Number of Children: N/A  . Years of Education: N/A   Occupational History  . Not on file.   Social History Main Topics  . Smoking status: Former Smoker    Types: Cigarettes    Quit date: 05/11/2011  . Smokeless tobacco: Not on file     Comment: stopped smoking approx. 1 month ago  . Alcohol Use: Yes     Comment: socially  . Drug Use: No  . Sexually Active: Not on file   Other Topics Concern  . Not on file   Social History Narrative  . No narrative on file    Family History  Problem Relation Age of Onset  . Diabetes Mother   . Hypertension Mother   . Kidney disease Mother   . Cancer Father   . Diabetes Father   . Hypertension Father   . Diabetes Sister      PHYSICAL EXAM: Filed Vitals:   07/04/12 0959  BP: 140/74  Pulse: 74  Temp: 97.9 F (36.6 C)  Resp: 20   General:  Well appearing. No respiratory difficulty HEENT: normal Neck: supple. no JVD. Carotids 2+ bilat; no bruits. No lymphadenopathy or thryomegaly appreciated. Cor: PMI nondisplaced. Regular rate & rhythm. No rubs, gallops or murmurs. Lungs: clear Abdomen: obese, soft, nontender, nondistended. No hepatosplenomegaly. No bruits or masses. Good bowel sounds. Extremities: no cyanosis, clubbing, rash, edema. LUE fistula with palpable thrill Neuro: alert & oriented x 3, cranial nerves grossly intact. moves all 4 extremities w/o difficulty. Affect pleasant.  Results for orders placed during the hospital encounter of 07/04/12 (from the past 24 hour(s))  BASIC METABOLIC PANEL     Status: Abnormal   Collection Time    07/04/12 10:05 AM      Result Value Range   Sodium 138  135 - 145 mEq/L   Potassium 3.4 (*) 3.5 - 5.1 mEq/L   Chloride 96  96 - 112 mEq/L   CO2 28  19 - 32 mEq/L   Glucose, Bld 113 (*) 70 - 99 mg/dL   BUN 29 (*)  6 - 23 mg/dL   Creatinine, Ser 9.60 (*) 0.50 - 1.35 mg/dL   Calcium 45.4  8.4 - 09.8 mg/dL   GFR calc non Af Amer 7 (*) >90 mL/min   GFR calc Af Amer 8 (*) >90 mL/min  CBC     Status: Abnormal   Collection Time    07/04/12 10:05 AM      Result Value Range   WBC 7.8  4.0 - 10.5 K/uL   RBC 3.55 (*) 4.22 - 5.81 MIL/uL   Hemoglobin 11.3 (*) 13.0 - 17.0 g/dL   HCT 11.9 (*) 14.7 - 82.9 %   MCV 91.0  78.0 - 100.0 fL   MCH 31.8  26.0 - 34.0 pg   MCHC 35.0  30.0 - 36.0 g/dL   RDW 56.2  13.0 - 86.5 %   Platelets 226  150 - 400 K/uL  PROTIME-INR     Status: None   Collection Time    07/04/12 10:05 AM      Result Value Range   Prothrombin Time 13.0  11.6 - 15.2 seconds   INR 0.99  0.00 - 1.49  GLUCOSE, CAPILLARY     Status: Abnormal   Collection Time    07/04/12 10:18 AM      Result Value Range   Glucose-Capillary 113 (*) 70 - 99 mg/dL   No  results found.   ASSESSMENT: 1. Abnormal stress echo 2. ESRD pending renal transplant 3. DM2 4. HTN 5. HL  PLAN/DISCUSSION:  Proceed with pre-transplant cardiac cath. Continue medical management of cardiac risk factors.  Daniel Bensimhon,MD 12:02 PM

## 2012-07-04 NOTE — H&P (Deleted)
History and Physical  Patient ID: Harold Freeman MRN: [id number], DOB: [birth year] Date of Encounter: 07/04/2012, 12:19 PM Primary Physician: Enrique Sack, MD Primary Cardiologist: Undergoing cath by Dr. Gala Romney Primary Nephrologist: Dr. Darrick Penna  Chief Complaint: here for cath for abnormal stress test  HPI: [name] is a 42 y/o M with no prior cardiac history but a history of DM, HTN, HL, GERD, ESRD on HD. He was diagnosed with polycystic kidney disease at age 31 and recently started dialysis January 2014. He is currently being evaluated at [facility] for renal transplant. He underwent stress echocardiogram 04/2012 whihch demonstrated a mildly dilated LV with resting EF 35% which slightly improved with stress at subtarget HR 139bpm (153bpm). 2D Echo 05/15/12 reportedly showed EF 35%, diffuse HK, mild LVH, MV calcification, trivial AR/MR/TR/PR. Cardiac cath was recommended and the patient preferred to have this done locally thus it was arranged for Dr. Gala Romney today (lives in [place]). Per [facility]'s notes, if the patient needs PCI, would favor BMS given shorter duration of clopidogrel/ticagrelor therapy. The patient reports very infrequent chest pain/SOB, about once every 2-3 months. It is very short lasting and is not particularly associated with any activity, meals, inspiration or palpation. He has a difficult time characterizing it since it does not happen often. He denies any chest pain or SOB today. No LEE, orthopnea, PND, nausea, vomiting, palpitations, diaphresis, syncope, or bleeding issues.   Past Medical History  Diagnosis Date  . Diabetes mellitus   . Obesity   . Hypertension   . Hyperlipidemia   . GERD (gastroesophageal reflux disease)   . Bronchitis     hx of  . Gout   . Polycystic kidney disease     ESRD w/ fistula; waiting on dialysis  . Tobacco abuse      Most Recent Cardiac Studies: See above   Surgical History:  Past Surgical History  Procedure Laterality Date  .  Bunionectomy      as a teenager  . Knee arthroscopy w/ acl reconstruction    . Av fistula placement  05/18/2011    Procedure: ARTERIOVENOUS (AV) FISTULA CREATION;  Surgeon: Nada Libman, MD;  Location: [facility] OR;  Service: Vascular;  Laterality: Left;  Left wrist fistula; ultrasound guided.     Home Meds: Prior to Admission medications   Medication Sig Start Date End Date Taking? Authorizing Provider  allopurinol (ZYLOPRIM) 300 MG tablet Take 300 mg by mouth daily.   Yes Historical Provider, MD  aspirin 81 MG tablet Take 81 mg by mouth daily.   Yes Historical Provider, MD  calcium acetate (PHOSLO) 667 MG capsule Take 667 mg by mouth 3 (three) times daily with meals.  06/08/11  Yes Historical Provider, MD  glimepiride (AMARYL) 4 MG tablet Take 4 mg by mouth daily before breakfast.   Yes Historical Provider, MD  losartan (COZAAR) 100 MG tablet Take 100 mg by mouth 2 (two) times daily.   Yes Historical Provider, MD  multivitamin (RENA-VIT) TABS tablet Take 1 tablet by mouth daily.   Yes Historical Provider, MD  omeprazole (PRILOSEC) 20 MG capsule Take 20 mg by mouth daily.   Yes Historical Provider, MD  simvastatin (ZOCOR) 20 MG tablet Take 20 mg by mouth every evening.   Yes Historical Provider, MD    Allergies: No Known Allergies  History   Social History  . Marital Status: Single    Spouse Name: N/A    Number of Children: N/A  . Years of Education: N/A  Occupational History  . Not on file.   Social History Main Topics  . Smoking status: Current Every Day Smoker    Types: Cigarettes    Last Attempt to Quit: 05/11/2011  . Smokeless tobacco: Not on file     Comment: Trying to quit  . Alcohol Use: Yes     Comment: Used to be once a week but now very infrequently since starting dialysis 01/2012  . Drug Use: No  . Sexually Active: Not on file   Other Topics Concern  . Not on file   Social History Narrative  . No narrative on file     Family History  Problem Relation Age of  Onset  . Diabetes Mother   . Hypertension Mother   . Kidney disease Mother     Polycystic kidney disease  . Cancer Father   . Diabetes Father   . Hypertension Father   . Diabetes Sister     Review of Systems: General: negative for chills, fever, night sweats or weight changes.  Cardiovascular: see above Dermatological: negative for rash Respiratory: negative for cough or wheezing Urologic: negative for hematuria (still urinates some) Abdominal: negative for nausea, vomiting, diarrhea, bright red blood per rectum, melena, or hematemesis Neurologic: negative for visual changes, syncope, or dizziness All other systems reviewed and are otherwise negative except as noted above.  Labs:   Lab Results  Component Value Date   WBC 7.8 07/04/2012   HGB 11.3* 07/04/2012   HCT 32.3* 07/04/2012   MCV 91.0 07/04/2012   PLT 226 07/04/2012     Recent Labs Lab 07/04/12 1005  NA 138  K 3.4*  CL 96  CO2 28  BUN 29*  CREATININE 8.70*  CALCIUM 10.0  GLUCOSE 113*   Radiology/Studies: as noted above   EKG: NSR 72bpm LAD, moderate criteria for LVH, nonspecific TW abnormality  Physical Exam: Blood pressure 140/74, pulse 74, temperature 97.9 F (36.6 C), temperature source Oral, resp. rate 20, height 6\' 1"  (1.854 m), weight 272 lb (123.378 kg), SpO2 98.00%. General: Well developed, overweight WM in no acute distress. Head: Normocephalic, atraumatic, sclera non-icteric, no xanthomas, nares are without discharge.  Neck: Negative for carotid bruits. JVD not elevated. Lungs: Clear bilaterally to auscultation without wheezes, rales, or rhonchi. Breathing is unlabored. Heart: RRR with S1 S2. No murmurs, rubs, or gallops appreciated. Abdomen: Soft, non-tender, non-distended with normoactive bowel sounds. No hepatomegaly. No rebound/guarding. No obvious abdominal masses. Msk:  Strength and tone appear normal for age. Extremities: No clubbing or cyanosis. No edema.  Distal pedal pulses are 2+ and  equal bilaterally. Neuro: Alert and oriented X 3. No focal deficit. No facial asymmetry. Moves all extremities spontaneously. Psych:  Responds to questions appropriately with a normal affect.    ASSESSMENT AND PLAN:  1. LV dysfunction with EF 35% by echo, abnormal stress echo (EF slightly improved with stress) 2. ESRD due to polycystic kidney disease, on HD, being evaluated for renal transplant 3. HTN 4. Hyperlipidemia 5. Diabetes mellitus 6. Tobacco abuse  For cath today as previously planned. Further recommendations will be based on coronary anatomy. Consider addition of BB given LV dysfunction.  Signed, Ronie Spies PA-C 07/04/2012, 12:19 PM

## 2012-07-04 NOTE — CV Procedure (Signed)
Cardiac Cath Procedure Note:  Indication: Abnormal stress test. Pre-kidney transplant.  Procedures performed:  1) Selective coronary angiography 2) Left heart catheterization 3) Left ventriculogram  Description of procedure:   The risks and indication of the procedure were explained. Consent was signed and placed on the chart. An appropriate timeout was taken prior to the procedure. The right groin was prepped and draped in the routine sterile fashion and anesthetized with 1% local lidocaine.   A 5 FR arterial sheath was placed in the right femoral artery using a modified Seldinger technique. Standard catheters including a JL5, JR4 and angled pigtail were used. All catheter exchanges were made over a wire.  Complications:  None apparent  Findings:  Ao Pressure: 138/81 (103) LV Pressure: 137/7/18 There was no signficant gradient across the aortic valve on pullback.  Left main: Short. Angiographically normal  LAD: Large vessel coursing to apex. Gives off several small diagonals. 40% calcified lesion in proximal to midesection   LCX: Very large dominant vessel. Gives off large OM-1, small OM-2. 2PLS and PDA.  Minimal plaque.   RCA: Moderate-sized non-dominant vessel with 90% lesion in midsection   LV-gram done in the RAO projection: Dilated LV with moderate global hypokinesis. Ejection fraction = 30-35%  Assessment: 1. 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 Non-ischemic cardiomyopathy with EF 30-35%  Plan/Discussion:  RCA lesion reviewed with interventional team and we agree that this is best treated medically. Will need medical management of NICM. D/W Dr. Jose Persia at [facility].    Daniel Bensimhon 1:12 PM

## 2012-10-16 ENCOUNTER — Other Ambulatory Visit: Payer: Self-pay | Admitting: Internal Medicine

## 2012-10-16 NOTE — Telephone Encounter (Signed)
Harold Freeman, this was orginally ordered by Dr Romeo Apple, he has not been seen in our office, please ask dr Romeo Apple regarding refill, is patient being managed at [facility] now?

## 2012-10-21 ENCOUNTER — Encounter (HOSPITAL_COMMUNITY)
Admission: RE | Admit: 2012-10-21 | Discharge: 2012-10-21 | Disposition: A | Discharge status: Home / Self Care | Payer: Self-pay | Source: Ambulatory Visit | Attending: Cardiology | Admitting: Cardiology

## 2012-10-21 NOTE — Progress Notes (Signed)
Oriented patient to the cardiac rehab maintenance program, no exercise today. Reviewed medication list and health history. Encouraged patient to bring CBG meter for pre and post exercise blood sugar checks. Will begin exercise Thursday, 10/23/2012.

## 2012-10-23 ENCOUNTER — Encounter (HOSPITAL_COMMUNITY)
Admission: RE | Admit: 2012-10-23 | Discharge: 2012-10-23 | Disposition: A | Discharge status: Home / Self Care | Payer: Self-pay | Source: Ambulatory Visit | Attending: Cardiology | Admitting: Cardiology

## 2012-10-23 DIAGNOSIS — E119 Type 2 diabetes mellitus without complications: Secondary | ICD-10-CM | POA: Insufficient documentation

## 2012-10-23 DIAGNOSIS — N186 End stage renal disease: Secondary | ICD-10-CM | POA: Insufficient documentation

## 2012-10-23 DIAGNOSIS — I120 Hypertensive chronic kidney disease with stage 5 chronic kidney disease or end stage renal disease: Secondary | ICD-10-CM | POA: Insufficient documentation

## 2012-10-23 DIAGNOSIS — R9439 Abnormal result of other cardiovascular function study: Secondary | ICD-10-CM | POA: Insufficient documentation

## 2012-10-23 DIAGNOSIS — Z5189 Encounter for other specified aftercare: Secondary | ICD-10-CM | POA: Insufficient documentation

## 2012-10-23 DIAGNOSIS — I428 Other cardiomyopathies: Secondary | ICD-10-CM | POA: Insufficient documentation

## 2012-10-23 DIAGNOSIS — E669 Obesity, unspecified: Secondary | ICD-10-CM | POA: Insufficient documentation

## 2012-10-23 DIAGNOSIS — I2510 Atherosclerotic heart disease of native coronary artery without angina pectoris: Secondary | ICD-10-CM | POA: Insufficient documentation

## 2012-10-28 ENCOUNTER — Encounter (HOSPITAL_COMMUNITY)
Admission: RE | Admit: 2012-10-28 | Discharge: 2012-10-28 | Disposition: A | Discharge status: Home / Self Care | Payer: Self-pay | Source: Ambulatory Visit | Attending: Cardiology | Admitting: Cardiology

## 2012-10-30 ENCOUNTER — Encounter (HOSPITAL_COMMUNITY)
Admission: RE | Admit: 2012-10-30 | Discharge: 2012-10-30 | Disposition: A | Discharge status: Home / Self Care | Payer: Self-pay | Source: Ambulatory Visit | Attending: Cardiology | Admitting: Cardiology

## 2012-11-04 ENCOUNTER — Encounter (HOSPITAL_COMMUNITY)
Admission: RE | Admit: 2012-11-04 | Discharge: 2012-11-04 | Disposition: A | Discharge status: Home / Self Care | Payer: Self-pay | Source: Ambulatory Visit | Attending: Cardiology | Admitting: Cardiology

## 2012-11-05 NOTE — Progress Notes (Signed)
11/04/12 0910- Reviewed home exercise guidelines with patient including endpoints, temperature precautions, target heart rate and rate of perceived exertion. Pt is walking as his mode of home exercise. Pt voices understanding of instructions given.   Cristy Hilts, MS, ACSM CES

## 2012-11-06 ENCOUNTER — Encounter (HOSPITAL_COMMUNITY)
Admission: RE | Admit: 2012-11-06 | Discharge: 2012-11-06 | Disposition: A | Discharge status: Home / Self Care | Payer: Self-pay | Source: Ambulatory Visit | Attending: Cardiology | Admitting: Cardiology

## 2012-11-11 ENCOUNTER — Encounter (HOSPITAL_COMMUNITY): Payer: Managed Care, Other (non HMO)

## 2012-11-13 ENCOUNTER — Encounter (HOSPITAL_COMMUNITY)
Admission: RE | Admit: 2012-11-13 | Discharge: 2012-11-13 | Disposition: A | Discharge status: Home / Self Care | Payer: Self-pay | Source: Ambulatory Visit | Attending: Cardiology | Admitting: Cardiology

## 2012-11-18 ENCOUNTER — Encounter (HOSPITAL_COMMUNITY)
Admission: RE | Admit: 2012-11-18 | Discharge: 2012-11-18 | Disposition: A | Discharge status: Home / Self Care | Payer: Self-pay | Source: Ambulatory Visit | Attending: Cardiology | Admitting: Cardiology

## 2012-11-20 ENCOUNTER — Encounter (HOSPITAL_COMMUNITY)
Admission: RE | Admit: 2012-11-20 | Discharge: 2012-11-20 | Disposition: A | Discharge status: Home / Self Care | Payer: Self-pay | Source: Ambulatory Visit | Attending: Cardiology | Admitting: Cardiology

## 2012-11-25 ENCOUNTER — Encounter (HOSPITAL_COMMUNITY)
Admission: RE | Admit: 2012-11-25 | Discharge: 2012-11-25 | Disposition: A | Discharge status: Home / Self Care | Payer: Self-pay | Source: Ambulatory Visit | Attending: Cardiology | Admitting: Cardiology

## 2012-11-25 DIAGNOSIS — R9439 Abnormal result of other cardiovascular function study: Secondary | ICD-10-CM | POA: Insufficient documentation

## 2012-11-25 DIAGNOSIS — Z5189 Encounter for other specified aftercare: Secondary | ICD-10-CM | POA: Insufficient documentation

## 2012-11-25 DIAGNOSIS — I2510 Atherosclerotic heart disease of native coronary artery without angina pectoris: Secondary | ICD-10-CM | POA: Insufficient documentation

## 2012-11-25 DIAGNOSIS — I428 Other cardiomyopathies: Secondary | ICD-10-CM | POA: Insufficient documentation

## 2012-11-27 ENCOUNTER — Encounter (HOSPITAL_COMMUNITY): Payer: Managed Care, Other (non HMO)

## 2012-12-02 ENCOUNTER — Encounter (HOSPITAL_COMMUNITY)
Admission: RE | Admit: 2012-12-02 | Discharge: 2012-12-02 | Disposition: A | Discharge status: Home / Self Care | Payer: Self-pay | Source: Ambulatory Visit | Attending: Cardiology | Admitting: Cardiology

## 2012-12-04 ENCOUNTER — Encounter (HOSPITAL_COMMUNITY)
Admission: RE | Admit: 2012-12-04 | Discharge: 2012-12-04 | Disposition: A | Discharge status: Home / Self Care | Payer: Self-pay | Source: Ambulatory Visit | Attending: Cardiology | Admitting: Cardiology

## 2012-12-09 ENCOUNTER — Encounter (HOSPITAL_COMMUNITY)
Admission: RE | Admit: 2012-12-09 | Discharge: 2012-12-09 | Disposition: A | Discharge status: Home / Self Care | Payer: Self-pay | Source: Ambulatory Visit | Attending: Cardiology | Admitting: Cardiology

## 2012-12-11 ENCOUNTER — Encounter (HOSPITAL_COMMUNITY)
Admission: RE | Admit: 2012-12-11 | Discharge: 2012-12-11 | Disposition: A | Discharge status: Home / Self Care | Payer: Self-pay | Source: Ambulatory Visit | Attending: Cardiology | Admitting: Cardiology

## 2012-12-16 ENCOUNTER — Encounter (HOSPITAL_COMMUNITY)
Admission: RE | Admit: 2012-12-16 | Discharge: 2012-12-16 | Disposition: A | Discharge status: Home / Self Care | Payer: Self-pay | Source: Ambulatory Visit | Attending: Cardiology | Admitting: Cardiology

## 2012-12-16 NOTE — Progress Notes (Signed)
After the 3rd station of exercise, patient c/o feeling lightheaded. Blood pressure at that time was 100/76, Gatorade given. Patient wasn't able to check CBG at home due to wrong strips for his meter, so after consulting with the nurse case manager his CBG was checked here and was 119. Patient stated that he was feeling better after the Gatorade and was able to participate in the cool-down stretches without further symptoms.   Cristy Hilts, MS, ACSM CES

## 2012-12-17 LAB — GLUCOSE, CAPILLARY: Glucose-Capillary: 119 mg/dL — ABNORMAL HIGH (ref 70–99)

## 2012-12-23 ENCOUNTER — Encounter (HOSPITAL_COMMUNITY): Payer: Managed Care, Other (non HMO)

## 2012-12-23 DIAGNOSIS — Z5189 Encounter for other specified aftercare: Secondary | ICD-10-CM | POA: Insufficient documentation

## 2012-12-23 DIAGNOSIS — I428 Other cardiomyopathies: Secondary | ICD-10-CM | POA: Insufficient documentation

## 2012-12-23 DIAGNOSIS — R9439 Abnormal result of other cardiovascular function study: Secondary | ICD-10-CM | POA: Insufficient documentation

## 2012-12-23 DIAGNOSIS — I2510 Atherosclerotic heart disease of native coronary artery without angina pectoris: Secondary | ICD-10-CM | POA: Insufficient documentation

## 2012-12-25 ENCOUNTER — Encounter (HOSPITAL_COMMUNITY): Payer: Managed Care, Other (non HMO)

## 2012-12-30 ENCOUNTER — Encounter (HOSPITAL_COMMUNITY)
Admission: RE | Admit: 2012-12-30 | Discharge: 2012-12-30 | Disposition: A | Discharge status: Home / Self Care | Payer: Self-pay | Source: Ambulatory Visit | Attending: Cardiology | Admitting: Cardiology

## 2013-01-01 ENCOUNTER — Encounter (HOSPITAL_COMMUNITY)
Admission: RE | Admit: 2013-01-01 | Discharge: 2013-01-01 | Disposition: A | Discharge status: Home / Self Care | Payer: Self-pay | Source: Ambulatory Visit | Attending: Cardiology | Admitting: Cardiology

## 2013-01-06 ENCOUNTER — Encounter (HOSPITAL_COMMUNITY)
Admission: RE | Admit: 2013-01-06 | Discharge: 2013-01-06 | Disposition: A | Discharge status: Home / Self Care | Payer: Self-pay | Source: Ambulatory Visit | Attending: Cardiology | Admitting: Cardiology

## 2013-01-08 ENCOUNTER — Encounter (HOSPITAL_COMMUNITY): Payer: Managed Care, Other (non HMO)

## 2013-01-13 ENCOUNTER — Encounter (HOSPITAL_COMMUNITY): Payer: Managed Care, Other (non HMO)

## 2013-01-20 ENCOUNTER — Encounter (HOSPITAL_COMMUNITY): Payer: Managed Care, Other (non HMO)

## 2013-01-27 ENCOUNTER — Encounter (HOSPITAL_COMMUNITY): Payer: Managed Care, Other (non HMO)

## 2013-01-27 DIAGNOSIS — Z5189 Encounter for other specified aftercare: Secondary | ICD-10-CM | POA: Insufficient documentation

## 2013-01-27 DIAGNOSIS — R9439 Abnormal result of other cardiovascular function study: Secondary | ICD-10-CM | POA: Insufficient documentation

## 2013-01-27 DIAGNOSIS — I2510 Atherosclerotic heart disease of native coronary artery without angina pectoris: Secondary | ICD-10-CM | POA: Insufficient documentation

## 2013-01-27 DIAGNOSIS — I428 Other cardiomyopathies: Secondary | ICD-10-CM | POA: Insufficient documentation

## 2013-01-29 ENCOUNTER — Encounter (HOSPITAL_COMMUNITY)
Admission: RE | Admit: 2013-01-29 | Discharge: 2013-01-29 | Disposition: A | Discharge status: Home / Self Care | Payer: Self-pay | Source: Ambulatory Visit | Attending: Cardiology | Admitting: Cardiology

## 2013-02-03 ENCOUNTER — Encounter (HOSPITAL_COMMUNITY): Payer: Managed Care, Other (non HMO)

## 2013-02-05 ENCOUNTER — Encounter (HOSPITAL_COMMUNITY): Payer: Managed Care, Other (non HMO)

## 2013-02-10 ENCOUNTER — Encounter (HOSPITAL_COMMUNITY)
Admission: RE | Admit: 2013-02-10 | Discharge: 2013-02-10 | Disposition: A | Discharge status: Home / Self Care | Payer: Self-pay | Source: Ambulatory Visit | Attending: Cardiology | Admitting: Cardiology

## 2013-02-12 ENCOUNTER — Encounter (HOSPITAL_COMMUNITY): Payer: Managed Care, Other (non HMO)

## 2013-02-17 ENCOUNTER — Encounter (HOSPITAL_COMMUNITY)
Admission: RE | Admit: 2013-02-17 | Discharge: 2013-02-17 | Disposition: A | Discharge status: Home / Self Care | Payer: Self-pay | Source: Ambulatory Visit | Attending: Cardiology | Admitting: Cardiology

## 2013-02-19 ENCOUNTER — Encounter (HOSPITAL_COMMUNITY)
Admission: RE | Admit: 2013-02-19 | Discharge: 2013-02-19 | Disposition: A | Discharge status: Home / Self Care | Payer: Self-pay | Source: Ambulatory Visit | Attending: Cardiology | Admitting: Cardiology

## 2013-02-24 ENCOUNTER — Encounter (HOSPITAL_COMMUNITY): Payer: Managed Care, Other (non HMO)

## 2013-02-24 DIAGNOSIS — Z5189 Encounter for other specified aftercare: Secondary | ICD-10-CM | POA: Insufficient documentation

## 2013-02-24 DIAGNOSIS — I428 Other cardiomyopathies: Secondary | ICD-10-CM | POA: Insufficient documentation

## 2013-02-24 DIAGNOSIS — R9439 Abnormal result of other cardiovascular function study: Secondary | ICD-10-CM | POA: Insufficient documentation

## 2013-02-24 DIAGNOSIS — I2510 Atherosclerotic heart disease of native coronary artery without angina pectoris: Secondary | ICD-10-CM | POA: Insufficient documentation

## 2013-02-26 ENCOUNTER — Encounter (HOSPITAL_COMMUNITY): Payer: Managed Care, Other (non HMO)

## 2013-03-03 ENCOUNTER — Encounter (HOSPITAL_COMMUNITY)
Admission: RE | Admit: 2013-03-03 | Discharge: 2013-03-03 | Disposition: A | Discharge status: Home / Self Care | Payer: Self-pay | Source: Ambulatory Visit | Attending: Cardiology | Admitting: Cardiology

## 2013-03-05 ENCOUNTER — Encounter (HOSPITAL_COMMUNITY): Payer: Managed Care, Other (non HMO)

## 2013-03-10 ENCOUNTER — Encounter (HOSPITAL_COMMUNITY): Payer: Managed Care, Other (non HMO)

## 2013-03-12 ENCOUNTER — Encounter (HOSPITAL_COMMUNITY): Payer: Managed Care, Other (non HMO)

## 2013-03-17 ENCOUNTER — Encounter (HOSPITAL_COMMUNITY): Payer: Managed Care, Other (non HMO)

## 2013-03-19 ENCOUNTER — Encounter (HOSPITAL_COMMUNITY): Payer: Managed Care, Other (non HMO)

## 2013-03-24 ENCOUNTER — Encounter (HOSPITAL_COMMUNITY): Payer: Managed Care, Other (non HMO) | Attending: Cardiology

## 2013-03-24 DIAGNOSIS — R9439 Abnormal result of other cardiovascular function study: Secondary | ICD-10-CM | POA: Insufficient documentation

## 2013-03-24 DIAGNOSIS — I428 Other cardiomyopathies: Secondary | ICD-10-CM | POA: Insufficient documentation

## 2013-03-24 DIAGNOSIS — I2510 Atherosclerotic heart disease of native coronary artery without angina pectoris: Secondary | ICD-10-CM | POA: Insufficient documentation

## 2013-03-24 DIAGNOSIS — Z5189 Encounter for other specified aftercare: Secondary | ICD-10-CM | POA: Insufficient documentation

## 2013-03-26 ENCOUNTER — Encounter (HOSPITAL_COMMUNITY): Payer: Managed Care, Other (non HMO)

## 2013-03-31 ENCOUNTER — Encounter (HOSPITAL_COMMUNITY): Payer: Managed Care, Other (non HMO)

## 2013-04-02 ENCOUNTER — Encounter (HOSPITAL_COMMUNITY): Payer: Managed Care, Other (non HMO)

## 2013-04-07 ENCOUNTER — Encounter (HOSPITAL_COMMUNITY): Payer: Managed Care, Other (non HMO)

## 2013-04-09 ENCOUNTER — Encounter (HOSPITAL_COMMUNITY): Payer: Managed Care, Other (non HMO)

## 2013-04-14 ENCOUNTER — Encounter (HOSPITAL_COMMUNITY): Payer: Managed Care, Other (non HMO)

## 2013-04-16 ENCOUNTER — Encounter (HOSPITAL_COMMUNITY): Payer: Managed Care, Other (non HMO)

## 2013-04-21 ENCOUNTER — Encounter (HOSPITAL_COMMUNITY): Payer: Managed Care, Other (non HMO)

## 2013-08-01 ENCOUNTER — Emergency Department (HOSPITAL_COMMUNITY)
Admission: EM | Admit: 2013-08-01 | Discharge: 2013-08-01 | Disposition: A | Discharge status: Home / Self Care | Payer: Medicare Other | Attending: Emergency Medicine | Admitting: Emergency Medicine

## 2013-08-01 ENCOUNTER — Encounter (HOSPITAL_COMMUNITY): Payer: Self-pay | Admitting: Emergency Medicine

## 2013-08-01 ENCOUNTER — Emergency Department (HOSPITAL_COMMUNITY): Payer: Medicare Other

## 2013-08-01 DIAGNOSIS — I120 Hypertensive chronic kidney disease with stage 5 chronic kidney disease or end stage renal disease: Secondary | ICD-10-CM | POA: Insufficient documentation

## 2013-08-01 DIAGNOSIS — N186 End stage renal disease: Secondary | ICD-10-CM | POA: Insufficient documentation

## 2013-08-01 DIAGNOSIS — Z8709 Personal history of other diseases of the respiratory system: Secondary | ICD-10-CM | POA: Insufficient documentation

## 2013-08-01 DIAGNOSIS — F17200 Nicotine dependence, unspecified, uncomplicated: Secondary | ICD-10-CM | POA: Insufficient documentation

## 2013-08-01 DIAGNOSIS — R300 Dysuria: Secondary | ICD-10-CM | POA: Insufficient documentation

## 2013-08-01 DIAGNOSIS — Z7982 Long term (current) use of aspirin: Secondary | ICD-10-CM | POA: Insufficient documentation

## 2013-08-01 DIAGNOSIS — Q613 Polycystic kidney, unspecified: Secondary | ICD-10-CM | POA: Insufficient documentation

## 2013-08-01 DIAGNOSIS — Z87442 Personal history of urinary calculi: Secondary | ICD-10-CM | POA: Insufficient documentation

## 2013-08-01 DIAGNOSIS — R109 Unspecified abdominal pain: Secondary | ICD-10-CM | POA: Insufficient documentation

## 2013-08-01 DIAGNOSIS — K219 Gastro-esophageal reflux disease without esophagitis: Secondary | ICD-10-CM | POA: Insufficient documentation

## 2013-08-01 DIAGNOSIS — R319 Hematuria, unspecified: Secondary | ICD-10-CM

## 2013-08-01 DIAGNOSIS — E669 Obesity, unspecified: Secondary | ICD-10-CM | POA: Insufficient documentation

## 2013-08-01 DIAGNOSIS — Z992 Dependence on renal dialysis: Secondary | ICD-10-CM | POA: Insufficient documentation

## 2013-08-01 DIAGNOSIS — Z79899 Other long term (current) drug therapy: Secondary | ICD-10-CM | POA: Insufficient documentation

## 2013-08-01 DIAGNOSIS — E785 Hyperlipidemia, unspecified: Secondary | ICD-10-CM | POA: Insufficient documentation

## 2013-08-01 DIAGNOSIS — E119 Type 2 diabetes mellitus without complications: Secondary | ICD-10-CM | POA: Insufficient documentation

## 2013-08-01 HISTORY — DX: Calculus of kidney: N20.0

## 2013-08-01 HISTORY — DX: Dependence on renal dialysis: Z99.2

## 2013-08-01 LAB — URINALYSIS, ROUTINE W REFLEX MICROSCOPIC
Bilirubin Urine: NEGATIVE
Glucose, UA: 250 mg/dL — AB
KETONES UR: 15 mg/dL — AB
NITRITE: NEGATIVE
Specific Gravity, Urine: 1.015 (ref 1.005–1.030)
UROBILINOGEN UA: 1 mg/dL (ref 0.0–1.0)
pH: 8.5 — ABNORMAL HIGH (ref 5.0–8.0)

## 2013-08-01 LAB — BASIC METABOLIC PANEL
ANION GAP: 17 — AB (ref 5–15)
BUN: 27 mg/dL — ABNORMAL HIGH (ref 6–23)
CALCIUM: 9.4 mg/dL (ref 8.4–10.5)
CO2: 29 mEq/L (ref 19–32)
Chloride: 95 mEq/L — ABNORMAL LOW (ref 96–112)
Creatinine, Ser: 7.48 mg/dL — ABNORMAL HIGH (ref 0.50–1.35)
GFR calc Af Amer: 9 mL/min — ABNORMAL LOW (ref >90)
GFR, EST NON AFRICAN AMERICAN: 8 mL/min — AB (ref >90)
GLUCOSE: 265 mg/dL — AB (ref 70–99)
POTASSIUM: 4.8 meq/L (ref 3.7–5.3)
SODIUM: 141 meq/L (ref 137–147)

## 2013-08-01 LAB — CBC WITH DIFFERENTIAL/PLATELET
BASOS ABS: 0 10*3/uL (ref 0.0–0.1)
Basophils Relative: 1 % (ref 0–1)
EOS ABS: 0.3 10*3/uL (ref 0.0–0.7)
EOS PCT: 3 % (ref 0–5)
HCT: 38.4 % — ABNORMAL LOW (ref 39.0–52.0)
Hemoglobin: 13.1 g/dL (ref 13.0–17.0)
LYMPHS ABS: 2.1 10*3/uL (ref 0.7–4.0)
LYMPHS PCT: 27 % (ref 12–46)
MCH: 33.5 pg (ref 26.0–34.0)
MCHC: 34.1 g/dL (ref 30.0–36.0)
MCV: 98.2 fL (ref 78.0–100.0)
Monocytes Absolute: 0.5 10*3/uL (ref 0.1–1.0)
Monocytes Relative: 6 % (ref 3–12)
NEUTROS PCT: 63 % (ref 43–77)
Neutro Abs: 5 10*3/uL (ref 1.7–7.7)
PLATELETS: 158 10*3/uL (ref 150–400)
RBC: 3.91 MIL/uL — AB (ref 4.22–5.81)
RDW: 14.2 % (ref 11.5–15.5)
WBC: 7.8 10*3/uL (ref 4.0–10.5)

## 2013-08-01 LAB — URINE MICROSCOPIC-ADD ON

## 2013-08-01 MED ORDER — OXYCODONE-ACETAMINOPHEN 5-325 MG PO TABS
1.0000 | ORAL_TABLET | Freq: Once | ORAL | Status: AC
  Administered 2013-08-01: 1 via ORAL
  Filled 2013-08-01: qty 1

## 2013-08-01 MED ORDER — HYDROCODONE-ACETAMINOPHEN 5-325 MG PO TABS: 1.0000 | ORAL_TABLET | Freq: Four times a day (QID) | ORAL | Status: DC | PRN

## 2013-08-01 NOTE — Discharge Instructions (Signed)
Hematuria, Adult  Hematuria is blood in your urine. It can be caused by a bladder infection, kidney infection, prostate infection, kidney stone, or cancer of your urinary tract. Infections can usually be treated with medicine, and a kidney stone usually will pass through your urine. If neither of these is the cause of your hematuria, further workup to find out the reason may be needed.  It is very important that you tell your health care provider about any blood you see in your urine, even if the blood stops without treatment or happens without causing pain. Blood in your urine that happens and then stops and then happens again can be a symptom of a very serious condition. Also, pain is not a symptom in the initial stages of many urinary cancers.  HOME CARE INSTRUCTIONS   · Drink lots of fluid, 3-4 quarts a day. If you have been diagnosed with an infection, cranberry juice is especially recommended, in addition to large amounts of water.  · Avoid caffeine, tea, and carbonated beverages, because they tend to irritate the bladder.  · Avoid alcohol because it may irritate the prostate.  · Only take over-the-counter or prescription medicines for pain, discomfort, or fever as directed by your health care provider.  · If you have been diagnosed with a kidney stone, follow your health care provider's instructions regarding straining your urine to catch the stone.  · Empty your bladder often. Avoid holding urine for long periods of time.  · After a bowel movement, women should cleanse front to back. Use each tissue only once.  · Empty your bladder before and after sexual intercourse if you are a male.  SEEK MEDICAL CARE IF:  You develop back pain, fever, a feeling of sickness in your stomach (nausea), or vomiting or if your symptoms are not better in 3 days. Return sooner if you are getting worse.  SEEK IMMEDIATE MEDICAL CARE IF:   · You have a persistent fever, with a temperature of 101.8°F (38.8°C) or greater.  · You  develop severe vomiting and are unable to keep the medicine down.  · You develop severe back or abdominal pain despite taking your medicines.  · You begin passing a large amount of blood or clots in your urine.  · You feel extremely weak or faint, or you pass out.  MAKE SURE YOU:   · Understand these instructions.  · Will watch your condition.  · Will get help right away if you are not doing well or get worse.  Document Released: 01/08/2005 Document Revised: 10/29/2012 Document Reviewed: 09/08/2012  Exit[employer]® Patient Information ©2015 [employer]. This information is not intended to replace advice given to you by your health care provider. Make sure you discuss any questions you have with your health care provider.

## 2013-08-01 NOTE — ED Notes (Signed)
Dr. Walton at the bedside  

## 2013-08-01 NOTE — ED Notes (Signed)
Misty Stanley, PA back at the bedside.

## 2013-08-01 NOTE — ED Notes (Signed)
Lisa, PA at the bedside.  

## 2013-08-01 NOTE — ED Provider Notes (Signed)
CSN: [id number]     Arrival date & time 08/01/13  0746 History   First MD Initiated Contact with Patient 08/01/13 [phone]     Chief Complaint  Patient presents with  . Hematuria     (Consider location/radiation/quality/duration/timing/severity/associated sxs/prior Treatment) The history is provided by the patient and medical records.   This is a 43 year old male with past medical history significant for hypertension, diabetes, hyperlipidemia, polycystic kidney disease, end-stage renal disease currently on hemodialysis, presenting to the ED for hematuria over the past week.  Patient states hematuria has been intermittent, denies any passage of clots in his urine. He was having some mild right flank pain a few days ago, now localized to his left side.  Some occasional dysuria and straining to urinate earlier in the week, none at present.  Patient's dialysis schedule is Monday Wednesday Friday, he completed his full treatment yesterday.  Denies fever, chills.  Patient states he has intermittent episodes of these symptoms due to his polycystic kidney disease.  He does have hx of kidney stones as well, all passed spontaneously without intervention.  VS stable on arrival.  Past Medical History  Diagnosis Date  . Diabetes mellitus   . Obesity   . Hypertension   . Hyperlipidemia   . GERD (gastroesophageal reflux disease)   . Bronchitis     hx of  . Gout   . Polycystic kidney disease     ESRD w/ fistula; waiting on dialysis  . Tobacco abuse   . Kidney stones   . Dialysis patient    Past Surgical History  Procedure Laterality Date  . Bunionectomy      as a teenager  . Knee arthroscopy w/ acl reconstruction    . Av fistula placement  05/18/2011    Procedure: ARTERIOVENOUS (AV) FISTULA CREATION;  Surgeon: Nada Libman, MD;  Location: [facility] OR;  Service: Vascular;  Laterality: Left;  Left wrist fistula; ultrasound guided.   Family History  Problem Relation Age of Onset  . Diabetes Mother   .  Hypertension Mother   . Kidney disease Mother     Polycystic kidney disease  . Cancer Father   . Diabetes Father   . Hypertension Father   . Diabetes Sister    History  Substance Use Topics  . Smoking status: Current Every Day Smoker    Types: Cigarettes    Last Attempt to Quit: 05/11/2011  . Smokeless tobacco: Not on file     Comment: Trying to quit  . Alcohol Use: Yes     Comment: Used to be once a week but now very infrequently since starting dialysis 01/2012    Review of Systems  Genitourinary: Positive for dysuria, hematuria and flank pain.  All other systems reviewed and are negative.     Allergies  Review of patient's allergies indicates no known allergies.  Home Medications   Prior to Admission medications   Medication Sig Start Date End Date Taking? Authorizing Provider  allopurinol (ZYLOPRIM) 300 MG tablet Take 300 mg by mouth daily.    Historical Provider, MD  aspirin 81 MG tablet Take 81 mg by mouth daily.    Historical Provider, MD  calcium acetate (PHOSLO) 667 MG capsule Take 667 mg by mouth 3 (three) times daily with meals.  06/08/11   Historical Provider, MD  carvedilol (COREG) 6.25 MG tablet TAKE ONE TABLET BY MOUTH TWICE DAILY 10/16/12   Dolores Patty, MD  glimepiride (AMARYL) 4 MG tablet Take 4 mg by mouth  daily before breakfast.    Historical Provider, MD  losartan (COZAAR) 100 MG tablet Take 100 mg by mouth daily.     Historical Provider, MD  multivitamin (RENA-VIT) TABS tablet Take 1 tablet by mouth daily.    Historical Provider, MD  omeprazole (PRILOSEC) 20 MG capsule Take 20 mg by mouth daily.    Historical Provider, MD  simvastatin (ZOCOR) 20 MG tablet Take 20 mg by mouth every evening.    Historical Provider, MD   BP 152/129  Pulse 74  Temp(Src) 98 F (36.7 C) (Oral)  Resp 20  Ht 6\' 1"  (1.854 m)  Wt 272 lb (123.378 kg)  BMI 35.89 kg/m2  SpO2 97%  Physical Exam  Nursing note and vitals reviewed. Constitutional: He is oriented to  person, place, and time. He appears well-developed and well-nourished.  HENT:  Head: Normocephalic and atraumatic.  Mouth/Throat: Oropharynx is clear and moist.  Eyes: Conjunctivae and EOM are normal. Pupils are equal, round, and reactive to light.  Neck: Normal range of motion.  Cardiovascular: Normal rate, regular rhythm and normal heart sounds.   Pulmonary/Chest: Effort normal and breath sounds normal.  Abdominal: Soft. Bowel sounds are normal. There is no tenderness. There is no guarding.  Abdomen soft, non-distended, no focal tenderness or peritoneal signs Left CVA tenderness  Musculoskeletal: Normal range of motion.  Left forearm AV fistula with strong thrill present; no overlying erythema or signs of infection  Neurological: He is alert and oriented to person, place, and time.  Skin: Skin is warm and dry.  Psychiatric: He has a normal mood and affect.    ED Course  Procedures (including critical care time) Labs Review Labs Reviewed  CBC WITH DIFFERENTIAL - Abnormal; Notable for the following:    RBC 3.91 (*)    HCT 38.4 (*)    All other components within normal limits  BASIC METABOLIC PANEL - Abnormal; Notable for the following:    Chloride 95 (*)    Glucose, Bld 265 (*)    BUN 27 (*)    Creatinine, Ser 7.48 (*)    GFR calc non Af Amer 8 (*)    GFR calc Af Amer 9 (*)    Anion gap 17 (*)    All other components within normal limits  URINALYSIS, ROUTINE W REFLEX MICROSCOPIC - Abnormal; Notable for the following:    Color, Urine RED (*)    APPearance TURBID (*)    pH 8.5 (*)    Glucose, UA 250 (*)    Hgb urine dipstick LARGE (*)    Ketones, ur 15 (*)    Protein, ur >300 (*)    Leukocytes, UA SMALL (*)    All other components within normal limits  URINE MICROSCOPIC-ADD ON - Abnormal; Notable for the following:    Bacteria, UA FEW (*)    All other components within normal limits    Imaging Review Ct Abdomen Pelvis Wo Contrast  08/01/2013   CLINICAL DATA:   Hematuria.  Polycystic kidney disease.  EXAM: CT ABDOMEN AND PELVIS WITHOUT CONTRAST  TECHNIQUE: Multidetector CT imaging of the abdomen and pelvis was performed following the standard protocol without IV contrast.  COMPARISON:  CT 12/24/2006 and 01/15/2004.  FINDINGS: Liver normal. Spleen normal. Splenosis. Pancreas normal. No biliary distention. Gallbladder is nondistended.  Polycystic kidney disease again noted. On today's examination that there are multiple new hyperdense well-circumscribed lesions, most likely hyperdense cysts. Several hyperdense ill-defined densities noted in both kidneys. These are most likely focal areas  of normal renal parenchyma. These regions however were not evident on prior study. Several new renal cortical calcifications are noted bilaterally. To further evaluate the kidneys particularly in light of the patient's hematuria MRI of the kidneys suggested. No hydronephrosis or hydroureter. No evidence of obstructing ureteral stone. Bladder is nondistended. Prostate normal size.  No significant adenopathy. Abdominal aorta normal caliber. Atherosclerotic vascular changes noted of the abdominal aorta and iliac vasculature.  Appendix normal. Sigmoid colon diverticulosis. No evidence of diverticulitis. There is no bowel distention. Stomach is nondistended. No free air. No mesenteric mass. No significant abdominal hernia. Small bilateral inguinal hernias with herniation of fat only noted.  Lung bases clear. Coronary artery disease. Heart size normal. Degenerative changes lumbar spine.  IMPRESSION: 1.  Polycystic kidney disease.  2. Multiple new well-circumscribed hyperdense lesions, most likely hyperdense cysts. Several areas of increased parenchymal density in both kidneys, these may just represent normal renal parenchyma. These findings however are new from multiple prior CTs and renal MRI is suggested to further evaluate the kidneys, particularly in light of patient's hematuria .  3. No  evidence of urolithiasis or hydronephrosis to account for the patient's hematuria.  4.  Coronary artery disease.  Peripheral vascular disease.   Electronically Signed   By: Maisie Fushomas  Register   On: 08/01/2013 11:03     EKG Interpretation None      MDM   Final diagnoses:  Hematuria  Polycystic kidney disease   43 year old hemodialysis patient with known polycystic kidney disease and history of kidney stones, presenting to the ED with left groin pain and hematuria. On exam he is afebrile and overall nontoxic appearing. Mild left CVA tenderness. Basic labs and urinalysis were obtained-- renal function appears baseline, potassium within normal limits.  UA with large blood, few bacteria.  Urine culture pending. Given pts hx and current sx, will obtain CT abdomen pelvis for further evaluation (last scan was 2005).  CT scan revealing multiple new hyperdense lesions, may be normal but these are new when compared with prior CTs, no noted stones.  Recommended MRI for further evaluation.  Discussed options of obtaining MRI while in emergency Department an outpatient basis, patient elected to have it done today is that he may discuss this with his nephrologist at his dialysis treatment on Monday.  Additional pain meds given, VS remain stable.  MRI pending at this time.  Care signed out to Dr. Gordy LevanWalton-- will follow MRI results and dispo accordingly.  Garlon HatchetLisa M Nakaiya Beddow, PA-C 08/01/13 [phone]

## 2013-08-01 NOTE — ED Notes (Signed)
Phlebotomy at the bedside  

## 2013-08-01 NOTE — ED Notes (Signed)
Pt placed back on the monitor. Wife at the bedside.

## 2013-08-01 NOTE — ED Provider Notes (Signed)
  Physical Exam  BP 127/80  Pulse 62  Temp(Src) 98 F (36.7 C) (Oral)  Resp 20  Ht 6\' 1"  (1.854 m)  Wt 272 lb (123.378 kg)  BMI 35.89 kg/m2  SpO2 96%  Physical Exam  ED Course  Procedures  MDM  43 yo M with hx of PKD, intermittent hematuria over one week. Occasional dysuria. CT performed, with new cysts - looked abnormal. Given history, recommended MRI for mass.   Plan to result with the MRI, plan to discharge regardless. Urinalysis sent for culture. No symptoms, with hematuria without overt evidence of infection. Will defer treatment to urine culture results.   The MRI demonstrates polycystic kidney disease. Demonstrates "multiple new well-circumscribed hyperdense lesions, most likely hyperdense cysts." Patient reassessed by myself and sleeping comfortably upon arrival. Patient is in no acute distress. Patient hemodynamically stable. Patient has plans to followup with his nephrologist and his upcoming dialysis appointment. Discussed the results of the MRI with patient and his family member at bedside. Patient stable for discharge. Discharged home in stable condition.    Imagene Sheller, MD 08/02/13 [phone]

## 2013-08-01 NOTE — ED Provider Notes (Signed)
Medical screening examination/treatment/procedure(s) were performed by non-physician practitioner and as supervising physician I was immediately available for consultation/collaboration.   EKG Interpretation None        Shon Baton, MD 08/01/13 [phone]

## 2013-08-01 NOTE — ED Notes (Signed)
Pt states he is a dialysis pt and has been having blood in his urine since 07/25/13. Off and on it has been clear. States he was having pain in the flank on the right side and now has pain in the left flank area. Has polycytic kidney disease. Goes to dialysis on M, W, F. Had his full treatment yesterday.

## 2013-08-02 NOTE — ED Provider Notes (Addendum)
I saw and evaluated the patient, reviewed the resident's note and I agree with the findings and plan.   EKG Interpretation None      Patient seen primarily by PA during a supervised visit.  See PA notes for details.  Shon Baton, MD 08/19/13 [phone]

## 2013-08-03 LAB — URINE CULTURE
Colony Count: NO GROWTH
Culture: NO GROWTH

## 2013-12-31 ENCOUNTER — Encounter (HOSPITAL_COMMUNITY): Payer: Self-pay | Admitting: Internal Medicine

## 2014-12-30 ENCOUNTER — Telehealth: Payer: Self-pay | Admitting: Cardiovascular Disease

## 2014-12-30 NOTE — Telephone Encounter (Signed)
Received faxed referral packet from [facility] for upcoming appointment with Dr. Allyson Sabal on 01/18/2015.  Records given to [facility]  cbr

## 2015-01-13 ENCOUNTER — Encounter: Payer: Self-pay | Admitting: Cardiovascular Disease

## 2015-01-13 ENCOUNTER — Ambulatory Visit (INDEPENDENT_AMBULATORY_CARE_PROVIDER_SITE_OTHER): Payer: Medicare Other | Admitting: Cardiovascular Disease

## 2015-01-13 ENCOUNTER — Ambulatory Visit (INDEPENDENT_AMBULATORY_CARE_PROVIDER_SITE_OTHER): Payer: Medicare Other

## 2015-01-13 VITALS — BP 138/90 | HR 95

## 2015-01-13 DIAGNOSIS — E785 Hyperlipidemia, unspecified: Secondary | ICD-10-CM | POA: Diagnosis not present

## 2015-01-13 DIAGNOSIS — R002 Palpitations: Secondary | ICD-10-CM | POA: Diagnosis not present

## 2015-01-13 DIAGNOSIS — I429 Cardiomyopathy, unspecified: Secondary | ICD-10-CM | POA: Diagnosis not present

## 2015-01-13 DIAGNOSIS — I428 Other cardiomyopathies: Secondary | ICD-10-CM

## 2015-01-13 DIAGNOSIS — I10 Essential (primary) hypertension: Secondary | ICD-10-CM | POA: Diagnosis not present

## 2015-01-13 DIAGNOSIS — E119 Type 2 diabetes mellitus without complications: Secondary | ICD-10-CM | POA: Insufficient documentation

## 2015-01-13 NOTE — Assessment & Plan Note (Addendum)
[name] was referred to me by Dr. Darrick Penna  for evaluation of palpitations.these have been getting progressively worse over the last several years. He has a history of nonischemic cardiopathy and chronic renal insufficiency on hemodialysis. He is on a beta blocker. He denies chest pain. There is no presyncope. He has had an EF in the 35% range in the past.. I am going to repeat a 2-D echo and obtain a 30 day event monitor.

## 2015-01-13 NOTE — Progress Notes (Signed)
01/13/2015 Harold Freeman   [birth year]  [id number]  Primary Physician GREEN, Harold Ishihara, MD Primary Cardiologist: Harold Gess MD Harold Freeman   HPI:  [name] is a 44 year old mildly overweight divorced Caucasian male with no biologic children referred by Dr. Darrick Freeman  for cardiovascular evaluation because of symptomatic palpitations. He currently works as a Engineer, civil (consulting) at [employer] He has a history of continued tobacco abuse smoking over 20 pack years currently trying to stop on Chantix. History of hypertension, hyperlipidemia and diabetes. He has never had a heart attack or stroke. He has a diagnosis of nonischemic cardiopathy with an EF in the 35% range status post cardiac catheterization performed by Dr. Gala Freeman  07/04/12 for workup prior to anticipated renal transplant. His major complaints or palpitations which have been increasing in frequency and severity of the last several years. He denies sicca. Presyncope. He does have chronic renal insufficiency on hemodialysis for the last 2 years secondary to polycystic kidney disease.   Current Outpatient Prescriptions  Medication Sig Dispense Refill  . allopurinol (ZYLOPRIM) 300 MG tablet Take 300 mg by mouth daily.    Marland Kitchen aspirin 81 MG tablet Take 81 mg by mouth daily.    Marland Kitchen buPROPion (WELLBUTRIN SR) 150 MG 12 hr tablet Take 150 mg by mouth daily.    . calcium acetate (PHOSLO) 667 MG capsule Take 2,001 mg by mouth 3 (three) times daily with meals.     . carvedilol (COREG) 6.25 MG tablet Take 6.25 mg by mouth 2 (two) times daily with a meal.    . glimepiride (AMARYL) 4 MG tablet Take 4 mg by mouth daily before breakfast.    . HYDROcodone-acetaminophen (NORCO/VICODIN) 5-325 MG per tablet Take 1 tablet by mouth every 6 (six) hours as needed for severe pain. 15 tablet 0  . losartan (COZAAR) 100 MG tablet Take 100 mg by mouth daily.     . multivitamin (RENA-VIT) TABS tablet Take 1 tablet by mouth daily.    Marland Kitchen  omeprazole (PRILOSEC) 20 MG capsule Take 20 mg by mouth daily.    . simvastatin (ZOCOR) 20 MG tablet Take 20 mg by mouth every evening.     No current facility-administered medications for this visit.    Allergies  Allergen Reactions  . Mushroom Extract Complex Other (See Comments)    Makes his stomach hurt    Social History   Social History  . Marital Status: Single    Spouse Name: N/A  . Number of Children: N/A  . Years of Education: N/A   Occupational History  . Not on file.   Social History Main Topics  . Smoking status: Current Every Day Smoker    Types: Cigarettes    Last Attempt to Quit: 05/11/2011  . Smokeless tobacco: Not on file     Comment: Trying to quit  . Alcohol Use: Yes     Comment: Used to be once a week but now very infrequently since starting dialysis 01/2012  . Drug Use: No  . Sexual Activity: Not on file   Other Topics Concern  . Not on file   Social History Narrative     Review of Systems: General: negative for chills, fever, night sweats or weight changes.  Cardiovascular: negative for chest pain, dyspnea on exertion, edema, orthopnea, palpitations, paroxysmal nocturnal dyspnea or shortness of breath Dermatological: negative for rash Respiratory: negative for cough or wheezing Urologic: negative for hematuria Abdominal: negative for nausea, vomiting, diarrhea, bright red  blood per rectum, melena, or hematemesis Neurologic: negative for visual changes, syncope, or dizziness All other systems reviewed and are otherwise negative except as noted above.    Blood pressure 138/90, pulse 95.  General appearance: alert and no distress Neck: no adenopathy, no carotid bruit, no JVD, supple, symmetrical, trachea midline and thyroid not enlarged, symmetric, no tenderness/mass/nodules Lungs: clear to auscultation bilaterally Heart: regular rate and rhythm, S1, S2 normal, no murmur, click, rub or gallop Extremities: extremities normal, atraumatic, no  cyanosis or edema  EKG normal sinus rhythm and 95 without ST or T-wave changes. I personally reviewed this EKG  ASSESSMENT AND PLAN:   Palpitations [name] was referred to me by Dr. Darrick Freeman  for evaluation of palpitations.these have been getting progressively worse over the last several years. He has a history of nonischemic cardiopathy and chronic renal insufficiency on hemodialysis. He is on a beta blocker. He denies chest pain. There is no presyncope. He has had an EF in the 35% range in the past.. I am going to repeat a 2-D echo and obtain a 30 day event monitor.  Nonischemic cardiomyopathy (HCC) History of nonischemic cardiopathy with EF 35% range. He had cardiac catheterization performed by Dr. Sampson Freeman 07/04/12 with a 90% lesion in a nondominant moderate size RCA. Medical therapy was recommended. He is on appropriate medications and denies symptoms of congestive heart failure.  Hyperlipidemia History of hyperlipidemia on simvastatin.. We will check a lipid and liver profile  HTN (hypertension) History of hypertension blood pressure measures 130/90. He is on carvedilol and losartan. Continue current meds at current dosing      Harold Gess MD [facility], [facility] 01/13/2015 3:27 PM

## 2015-01-13 NOTE — Patient Instructions (Addendum)
Medication Instructions:  Your physician recommends that you continue on your current medications as directed. Please refer to the Current Medication list given to you today.   Labwork: Your physician recommends that you return for lab work in: FASTING (lipid/liver) The lab can be found on the FIRST FLOOR of out building in [address]   Testing/Procedures: Your physician has requested that you have an echocardiogram. Echocardiography is a painless test that uses sound waves to create images of your heart. It provides your doctor with information about the size and shape of your heart and how well your heart's chambers and valves are working. This procedure takes approximately one hour. There are no restrictions for this procedure.  Your physician has recommended that you wear an event monitor. Event monitors are medical devices that record the heart's electrical activity. Doctors most often [place] these monitors to diagnose arrhythmias. Arrhythmias are problems with the speed or rhythm of the heartbeat. The monitor is a small, portable device. You can wear one while you do your normal daily activities. This is usually used to diagnose what is causing palpitations/syncope (passing out).    Follow-Up: Your physician recommends that you schedule a follow-up appointment in: 6-8 weeks with Dr. Allyson Sabal   Any Other Special Instructions Will Be Listed Below (If Applicable).     If you need a refill on your cardiac medications before your next appointment, please call your pharmacy.

## 2015-01-13 NOTE — Assessment & Plan Note (Signed)
History of hypertension blood pressure measures 130/90. He is on carvedilol and losartan. Continue current meds at current dosing

## 2015-01-13 NOTE — Assessment & Plan Note (Signed)
History of nonischemic cardiopathy with EF 35% range. He had cardiac catheterization performed by Dr. Sampson Goon 07/04/12 with a 90% lesion in a nondominant moderate size RCA. Medical therapy was recommended. He is on appropriate medications and denies symptoms of congestive heart failure.

## 2015-01-13 NOTE — Assessment & Plan Note (Signed)
History of hyperlipidemia on simvastatin.. We will check a lipid and liver profile

## 2015-01-18 ENCOUNTER — Ambulatory Visit (HOSPITAL_COMMUNITY): Payer: Medicare Other | Attending: Cardiovascular Disease

## 2015-01-18 ENCOUNTER — Other Ambulatory Visit: Payer: Self-pay

## 2015-01-18 DIAGNOSIS — I10 Essential (primary) hypertension: Secondary | ICD-10-CM | POA: Diagnosis present

## 2015-01-18 DIAGNOSIS — I517 Cardiomegaly: Secondary | ICD-10-CM | POA: Insufficient documentation

## 2015-01-18 DIAGNOSIS — R002 Palpitations: Secondary | ICD-10-CM | POA: Insufficient documentation

## 2015-01-18 DIAGNOSIS — E785 Hyperlipidemia, unspecified: Secondary | ICD-10-CM | POA: Diagnosis not present

## 2015-01-21 LAB — HEPATIC FUNCTION PANEL
ALK PHOS: 118 U/L — AB (ref 40–115)
ALT: 22 U/L (ref 9–46)
AST: 17 U/L (ref 10–40)
Albumin: 4.2 g/dL (ref 3.6–5.1)
BILIRUBIN DIRECT: 0.1 mg/dL (ref <=0.2)
BILIRUBIN INDIRECT: 0.4 mg/dL (ref 0.2–1.2)
BILIRUBIN TOTAL: 0.5 mg/dL (ref 0.2–1.2)
Total Protein: 7.5 g/dL (ref 6.1–8.1)

## 2015-01-21 LAB — LIPID PANEL
CHOL/HDL RATIO: 4.3 ratio (ref <=5.0)
CHOLESTEROL: 107 mg/dL — AB (ref 125–200)
HDL: 25 mg/dL — ABNORMAL LOW (ref >=40)
LDL CALC: 16 mg/dL (ref <130)
Triglycerides: 329 mg/dL — ABNORMAL HIGH (ref <150)
VLDL: 66 mg/dL — AB (ref <30)

## 2015-03-08 ENCOUNTER — Ambulatory Visit: Payer: Medicare Other | Admitting: Cardiovascular Disease

## 2015-07-20 ENCOUNTER — Encounter (HOSPITAL_COMMUNITY): Payer: Self-pay

## 2015-07-20 ENCOUNTER — Emergency Department (HOSPITAL_COMMUNITY)
Admission: EM | Admit: 2015-07-20 | Discharge: 2015-07-20 | Disposition: A | Discharge status: Home / Self Care | Payer: Medicare Other | Attending: Emergency Medicine | Admitting: Emergency Medicine

## 2015-07-20 ENCOUNTER — Emergency Department (HOSPITAL_COMMUNITY): Payer: Medicare Other

## 2015-07-20 DIAGNOSIS — Z79899 Other long term (current) drug therapy: Secondary | ICD-10-CM | POA: Insufficient documentation

## 2015-07-20 DIAGNOSIS — R0789 Other chest pain: Secondary | ICD-10-CM | POA: Diagnosis present

## 2015-07-20 DIAGNOSIS — I10 Essential (primary) hypertension: Secondary | ICD-10-CM | POA: Insufficient documentation

## 2015-07-20 DIAGNOSIS — Z7984 Long term (current) use of oral hypoglycemic drugs: Secondary | ICD-10-CM | POA: Insufficient documentation

## 2015-07-20 DIAGNOSIS — Z7982 Long term (current) use of aspirin: Secondary | ICD-10-CM | POA: Diagnosis not present

## 2015-07-20 DIAGNOSIS — E119 Type 2 diabetes mellitus without complications: Secondary | ICD-10-CM | POA: Diagnosis not present

## 2015-07-20 DIAGNOSIS — R079 Chest pain, unspecified: Secondary | ICD-10-CM

## 2015-07-20 DIAGNOSIS — IMO0001 Reserved for inherently not codable concepts without codable children: Secondary | ICD-10-CM

## 2015-07-20 DIAGNOSIS — R030 Elevated blood-pressure reading, without diagnosis of hypertension: Secondary | ICD-10-CM

## 2015-07-20 DIAGNOSIS — F17210 Nicotine dependence, cigarettes, uncomplicated: Secondary | ICD-10-CM | POA: Insufficient documentation

## 2015-07-20 LAB — BASIC METABOLIC PANEL
Anion gap: 15 (ref 5–15)
BUN: 51 mg/dL — ABNORMAL HIGH (ref 6–20)
CALCIUM: 10 mg/dL (ref 8.9–10.3)
CO2: 26 mmol/L (ref 22–32)
CREATININE: 12.81 mg/dL — AB (ref 0.61–1.24)
Chloride: 93 mmol/L — ABNORMAL LOW (ref 101–111)
GFR calc Af Amer: 5 mL/min — ABNORMAL LOW (ref >60)
GFR calc non Af Amer: 4 mL/min — ABNORMAL LOW (ref >60)
GLUCOSE: 186 mg/dL — AB (ref 65–99)
Potassium: 4.8 mmol/L (ref 3.5–5.1)
Sodium: 134 mmol/L — ABNORMAL LOW (ref 135–145)

## 2015-07-20 LAB — I-STAT TROPONIN, ED: Troponin i, poc: 0 ng/mL (ref 0.00–0.08)

## 2015-07-20 LAB — CBC WITH DIFFERENTIAL/PLATELET
Basophils Absolute: 0 10*3/uL (ref 0.0–0.1)
Basophils Relative: 0 %
EOS ABS: 0.2 10*3/uL (ref 0.0–0.7)
EOS PCT: 3 %
HEMATOCRIT: 35 % — AB (ref 39.0–52.0)
Hemoglobin: 11.5 g/dL — ABNORMAL LOW (ref 13.0–17.0)
LYMPHS ABS: 1.7 10*3/uL (ref 0.7–4.0)
Lymphocytes Relative: 24 %
MCH: 30.3 pg (ref 26.0–34.0)
MCHC: 32.9 g/dL (ref 30.0–36.0)
MCV: 92.1 fL (ref 78.0–100.0)
Monocytes Absolute: 0.6 10*3/uL (ref 0.1–1.0)
Monocytes Relative: 9 %
NEUTROS PCT: 64 %
Neutro Abs: 4.7 10*3/uL (ref 1.7–7.7)
Platelets: 164 10*3/uL (ref 150–400)
RBC: 3.8 MIL/uL — ABNORMAL LOW (ref 4.22–5.81)
RDW: 14.1 % (ref 11.5–15.5)
WBC: 7.2 10*3/uL (ref 4.0–10.5)

## 2015-07-20 NOTE — ED Notes (Signed)
Patient was at work and felt light headed and staff found his BP greater than 200. Due to have dialysis this afternoon. Took extra BP pill while at work. States that his head just doesn't feel right.

## 2015-07-20 NOTE — ED Provider Notes (Signed)
Medical screening examination/treatment/procedure(s) were conducted as a shared visit with non-physician practitioner(s) and myself.  I personally evaluated the patient during the encounter.  45-year-old male here with hypertension and headache and some vague chest pain. I evaluated him after blood pressure improved and his head feels better and still has no chest pain this time. No shortness of breath. On exam he has no crackles, no gallops on his cardiac exam, lower extremity edema, no abdominal tenderness. Suspect his headache is related to blood pressure and this why it has improved with blood pressure control. I doubt that his chest pain is ACS as he has a low heart score so will likely check a second troponin to ensure no change in also second EKG.   EKG Interpretation   Date/Time:  Wednesday July 20 2015 11:46:40 EDT Ventricular Rate:  63 PR Interval:    QRS Duration: 111 QT Interval:  420 QTC Calculation: 430 R Axis:   -34 Text Interpretation:  Sinus rhythm Left axis deviation Abnormal R-wave  progression, early transition No new e/o ischemia since april 2013  Confirmed by [facility] MD, Brinlee Gambrell [phone]) on 07/20/2015 1:15:26 PM        Marily Memos, MD 07/21/15 [phone]

## 2015-07-20 NOTE — Discharge Instructions (Signed)
Please follow up with your primary care provider for further management of your blood pressure.  Do not miss your dialysis appointment today.  Return to the ER if you have any concerns.  Hypertension Hypertension is another name for high blood pressure. High blood pressure forces your heart to work harder to pump blood. A blood pressure reading has two numbers, which includes a higher number over a lower number (example: 110/72). HOME CARE   Have your blood pressure rechecked by your doctor.  Only take medicine as told by your doctor. Follow the directions carefully. The medicine does not work as well if you skip doses. Skipping doses also puts you at risk for problems.  Do not smoke.  Monitor your blood pressure at home as told by your doctor. GET HELP IF:  You think you are having a reaction to the medicine you are taking.  You have repeat headaches or feel dizzy.  You have puffiness (swelling) in your ankles.  You have trouble with your vision. GET HELP RIGHT AWAY IF:   You get a very bad headache and are confused.  You feel weak, numb, or faint.  You get chest or belly (abdominal) pain.  You throw up (vomit).  You cannot breathe very well. MAKE SURE YOU:   Understand these instructions.  Will watch your condition.  Will get help right away if you are not doing well or get worse.   This information is not intended to replace advice given to you by your health care provider. Make sure you discuss any questions you have with your health care provider.   Document Released: 06/27/2007 Document Revised: 01/13/2013 Document Reviewed: 10/31/2012 Elsevier Interactive Patient Education [employer]

## 2015-07-20 NOTE — ED Notes (Signed)
Pt wheeled back from waiting room to room by his nurse.

## 2015-07-20 NOTE — ED Provider Notes (Signed)
CSN: [id number]     Arrival date & time 07/20/15  1032 History   First MD Initiated Contact with Patient 07/20/15 1054     No chief complaint on file.    (Consider location/radiation/quality/duration/timing/severity/associated sxs/prior Treatment) HPI   45 year old obese male with history of non-insulin-dependent diabetes, hypertension, hyperlipidemia, polycystic kidney disease currently a dialysis patient (M-W-F)Presenting for evaluation of high blood pressure. Patient states while at work this morning he developed tightness and pressure in his head follows with sensation of generalized weakness, "swimmy headed", and a vague midsternal chest pressure. Chest pressure lasting for approximately 20 minutes and resolved. He did report mild shortness of breath during that episode. He checked his blood pressure using a wrist cuff at work and noted that his blood pressure was elevated at 240 systolic. Patient states he took his blood pressure medication this morning but took 1 additional dose of carvedilol, drink some fluid and wait for a while. Since symptoms did not improve, patient came to the ED for further evaluation. Patient states that his chest discomfort has since resolved. Patient denies any recent sickness except for some mild throat discomfort yesterday which has since resolved after he took a diabetic Tussin last night. Denies any recent medication changes, he has been compliant with his medication. Started Chantix a month ago. No recent illness, dietary changes are recent stressor. Denies having any acute shortness of breath, nausea, abdominal pain or paresthesia. His next dialysis is this afternoon at 3 PM.  Report remote heart cath without stent placement. No hx of MI.       Past Medical History  Diagnosis Date  . Diabetes mellitus   . Obesity   . Hypertension   . Hyperlipidemia   . GERD (gastroesophageal reflux disease)   . Bronchitis     hx of  . Gout   . Polycystic kidney  disease     ESRD w/ fistula; waiting on dialysis  . Tobacco abuse   . Kidney stones   . Dialysis patient (HCC)   . Palpitations    Past Surgical History  Procedure Laterality Date  . Bunionectomy      as a teenager  . Knee arthroscopy w/ acl reconstruction    . Av fistula placement  05/18/2011    Procedure: ARTERIOVENOUS (AV) FISTULA CREATION;  Surgeon: Nada Libman, MD;  Location: [facility] OR;  Service: Vascular;  Laterality: Left;  Left wrist fistula; ultrasound guided.  . Cardiac catheterization    . Left heart catheterization with coronary angiogram N/A 07/04/2012    Procedure: LEFT HEART CATHETERIZATION WITH CORONARY ANGIOGRAM;  Surgeon: Dolores Patty, MD;  Location: [facility] CATH LAB;  Service: Cardiovascular;  Laterality: N/A;   Family History  Problem Relation Age of Onset  . Diabetes Mother   . Hypertension Mother   . Kidney disease Mother     Polycystic kidney disease  . Cancer Father   . Diabetes Father   . Hypertension Father   . Diabetes Sister    Social History  Substance Use Topics  . Smoking status: Current Every Day Smoker    Types: Cigarettes    Last Attempt to Quit: 05/11/2011  . Smokeless tobacco: None     Comment: Trying to quit  . Alcohol Use: Yes     Comment: Used to be once a week but now very infrequently since starting dialysis 01/2012    Review of Systems  All other systems reviewed and are negative.     Allergies  Mushroom extract  complex  Home Medications   Prior to Admission medications   Medication Sig Start Date End Date Taking? Authorizing Provider  allopurinol (ZYLOPRIM) 300 MG tablet Take 300 mg by mouth daily.    Historical Provider, MD  aspirin 81 MG tablet Take 81 mg by mouth daily.    Historical Provider, MD  buPROPion (WELLBUTRIN SR) 150 MG 12 hr tablet Take 150 mg by mouth daily. 07/11/13   Historical Provider, MD  calcium acetate (PHOSLO) 667 MG capsule Take 2,001 mg by mouth 3 (three) times daily with meals.  06/08/11    Historical Provider, MD  carvedilol (COREG) 6.25 MG tablet Take 6.25 mg by mouth 2 (two) times daily with a meal.    Historical Provider, MD  glimepiride (AMARYL) 4 MG tablet Take 4 mg by mouth daily before breakfast.    Historical Provider, MD  HYDROcodone-acetaminophen (NORCO/VICODIN) 5-325 MG per tablet Take 1 tablet by mouth every 6 (six) hours as needed for severe pain. 08/01/13   Imagene Sheller, MD  losartan (COZAAR) 100 MG tablet Take 100 mg by mouth daily.     Historical Provider, MD  multivitamin (RENA-VIT) TABS tablet Take 1 tablet by mouth daily.    Historical Provider, MD  omeprazole (PRILOSEC) 20 MG capsule Take 20 mg by mouth daily.    Historical Provider, MD  simvastatin (ZOCOR) 20 MG tablet Take 20 mg by mouth every evening.    Historical Provider, MD   BP 178/108 mmHg  Pulse 69  Temp(Src) 98.1 F (36.7 C)  Resp 18  Ht 6\' 1"  (1.854 m)  Wt 122.925 kg  BMI 35.76 kg/m2  SpO2 96% Physical Exam  Constitutional: He is oriented to person, place, and time. He appears well-developed and well-nourished. No distress.  Caucasian male appears to be in no acute discomfort.  HENT:  Head: Atraumatic.  Mouth/Throat: Oropharynx is clear and moist.  Eyes: Conjunctivae are normal.  Neck: Normal range of motion. Neck supple. No JVD present.  No nuchal rigidity  Cardiovascular: Normal rate and regular rhythm.   Pulmonary/Chest: Effort normal and breath sounds normal. No respiratory distress. He has no wheezes.  Abdominal: Soft. There is no tenderness.  Neurological: He is alert and oriented to person, place, and time. He has normal strength and normal reflexes. No cranial nerve deficit or sensory deficit. He displays a negative Romberg sign. Coordination normal. GCS eye subscore is 4. GCS verbal subscore is 5. GCS motor subscore is 6.  Skin: No rash noted.     Psychiatric: He has a normal mood and affect.  Nursing note and vitals reviewed.   ED Course  Procedures (including critical  care time) Labs Review Labs Reviewed  CBC WITH DIFFERENTIAL/PLATELET - Abnormal; Notable for the following:    RBC 3.80 (*)    Hemoglobin 11.5 (*)    HCT 35.0 (*)    All other components within normal limits  BASIC METABOLIC PANEL - Abnormal; Notable for the following:    Sodium 134 (*)    Chloride 93 (*)    Glucose, Bld 186 (*)    BUN 51 (*)    Creatinine, Ser 12.81 (*)    GFR calc non Af Amer 4 (*)    GFR calc Af Amer 5 (*)    All other components within normal limits  I-STAT TROPOININ, ED    Imaging Review Dg Chest 2 View  07/20/2015  CLINICAL DATA:  Chest pain and shortness of breath since early this morning, history smoker, diabetes mellitus, hypertension,  GERD, end-stage renal disease on dialysis EXAM: CHEST  2 VIEW COMPARISON:  12/12/2011 FINDINGS: Normal heart size, mediastinal contours, and pulmonary vascularity. Lungs clear. No pleural effusion or pneumothorax. Bones unremarkable. IMPRESSION: No acute abnormalities. Electronically Signed   By: Ulyses Southward M.D.   On: 07/20/2015 13:03   I have personally reviewed and evaluated these images and lab results as part of my medical decision-making.   EKG Interpretation   Date/Time:  Wednesday July 20 2015 11:46:40 EDT Ventricular Rate:  63 PR Interval:    QRS Duration: 111 QT Interval:  420 QTC Calculation: 430 R Axis:   -34 Text Interpretation:  Sinus rhythm Left axis deviation Abnormal R-wave  progression, early transition No new e/o ischemia since april 2013  Confirmed by [facility] MD, Barbara Cower [phone]) on 07/20/2015 1:15:26 PM      MDM   Final diagnoses:  Chest pain  Elevated blood pressure    BP 165/92 mmHg  Pulse 82  Temp(Src) 98.1 F (36.7 C)  Resp 16  Ht 6\' 1"  (1.854 m)  Wt 122.925 kg  BMI 35.76 kg/m2  SpO2 98%   12:38 PM  dialysis patient here with sensation of lightheadedness, dizziness, head discomfort and initial blood pressure greater than 200 systolic earlier today. At this time he has no focal  neuro deficit. Initially he did report some mild midsternal chest discomfort which has since resolved. He is neurovascularly intact, have low suspicion for aortic dissection, acute stroke, or ACS. Workup initiated. Blood pressure has since normalized after patient to twice his normal dose of carvedilol. His dialysis appointment is at 3 PM today.   1:35 PM BP stable.  Pt ambulate without difficulty.  EKG without ischemic changes, CXR normal.  CKD noted on labs, but normal potassium level.  Currently patient resting comfortably. He has a dialysis appointment in 1 hour. Asacol for discharge patient at this time. He'll follow-up with his primary care provider for further evaluation. Incidental return promptly if his condition worsened or if he has any other concern. Care discussed with Dr. Erin Hearing.  Fayrene Helper, PA-C 07/20/15 1340  Marily Memos, MD 07/20/15 1440

## 2015-07-20 NOTE — ED Notes (Signed)
Pt placed on monitor on arrival to rm D30.

## 2015-07-20 NOTE — ED Notes (Signed)
Pt had an episode of nausea/vomiting on Saturday-- then was fine, head started feeling "pressure" across forehead-- BP was high this am while at work-- is scheduled for dialysis at 3pm. Took an extra BP pill (labetolol) this am after having BP checked at work.

## 2015-07-20 NOTE — ED Notes (Signed)
Patient ambulated in hallways without difficulty.  

## 2015-12-21 ENCOUNTER — Encounter (HOSPITAL_COMMUNITY): Payer: Self-pay

## 2015-12-21 ENCOUNTER — Emergency Department (HOSPITAL_COMMUNITY): Payer: Medicare Other

## 2015-12-21 ENCOUNTER — Emergency Department (HOSPITAL_COMMUNITY)
Admission: EM | Admit: 2015-12-21 | Discharge: 2015-12-21 | Disposition: A | Discharge status: Home / Self Care | Payer: Medicare Other | Attending: Emergency Medicine | Admitting: Emergency Medicine

## 2015-12-21 DIAGNOSIS — E1122 Type 2 diabetes mellitus with diabetic chronic kidney disease: Secondary | ICD-10-CM | POA: Insufficient documentation

## 2015-12-21 DIAGNOSIS — K5732 Diverticulitis of large intestine without perforation or abscess without bleeding: Secondary | ICD-10-CM | POA: Diagnosis not present

## 2015-12-21 DIAGNOSIS — Z7982 Long term (current) use of aspirin: Secondary | ICD-10-CM | POA: Diagnosis not present

## 2015-12-21 DIAGNOSIS — N186 End stage renal disease: Secondary | ICD-10-CM | POA: Diagnosis not present

## 2015-12-21 DIAGNOSIS — R319 Hematuria, unspecified: Secondary | ICD-10-CM

## 2015-12-21 DIAGNOSIS — F17210 Nicotine dependence, cigarettes, uncomplicated: Secondary | ICD-10-CM | POA: Insufficient documentation

## 2015-12-21 DIAGNOSIS — Z7984 Long term (current) use of oral hypoglycemic drugs: Secondary | ICD-10-CM | POA: Insufficient documentation

## 2015-12-21 DIAGNOSIS — R109 Unspecified abdominal pain: Secondary | ICD-10-CM | POA: Diagnosis present

## 2015-12-21 DIAGNOSIS — I120 Hypertensive chronic kidney disease with stage 5 chronic kidney disease or end stage renal disease: Secondary | ICD-10-CM | POA: Diagnosis not present

## 2015-12-21 LAB — URINALYSIS, ROUTINE W REFLEX MICROSCOPIC
BILIRUBIN URINE: NEGATIVE
Glucose, UA: 250 mg/dL — AB
KETONES UR: NEGATIVE mg/dL
Leukocytes, UA: NEGATIVE
NITRITE: NEGATIVE
PH: 8 (ref 5.0–8.0)
Protein, ur: >300 mg/dL — AB
SPECIFIC GRAVITY, URINE: 1.01 (ref 1.005–1.030)

## 2015-12-21 LAB — CBC
HCT: 33.1 % — ABNORMAL LOW (ref 39.0–52.0)
HEMOGLOBIN: 11.1 g/dL — AB (ref 13.0–17.0)
MCH: 31.4 pg (ref 26.0–34.0)
MCHC: 33.5 g/dL (ref 30.0–36.0)
MCV: 93.8 fL (ref 78.0–100.0)
Platelets: 180 10*3/uL (ref 150–400)
RBC: 3.53 MIL/uL — ABNORMAL LOW (ref 4.22–5.81)
RDW: 14.2 % (ref 11.5–15.5)
WBC: 6.7 10*3/uL (ref 4.0–10.5)

## 2015-12-21 LAB — COMPREHENSIVE METABOLIC PANEL
ALBUMIN: 3.6 g/dL (ref 3.5–5.0)
ALK PHOS: 68 U/L (ref 38–126)
ALT: 26 U/L (ref 17–63)
ANION GAP: 15 (ref 5–15)
AST: 26 U/L (ref 15–41)
BILIRUBIN TOTAL: 0.6 mg/dL (ref 0.3–1.2)
BUN: 50 mg/dL — AB (ref 6–20)
CALCIUM: 9.7 mg/dL (ref 8.9–10.3)
CO2: 26 mmol/L (ref 22–32)
Chloride: 96 mmol/L — ABNORMAL LOW (ref 101–111)
Creatinine, Ser: 13.01 mg/dL — ABNORMAL HIGH (ref 0.61–1.24)
GFR calc Af Amer: 5 mL/min — ABNORMAL LOW (ref >60)
GFR calc non Af Amer: 4 mL/min — ABNORMAL LOW (ref >60)
GLUCOSE: 127 mg/dL — AB (ref 65–99)
Potassium: 5.2 mmol/L — ABNORMAL HIGH (ref 3.5–5.1)
SODIUM: 137 mmol/L (ref 135–145)
TOTAL PROTEIN: 7.3 g/dL (ref 6.5–8.1)

## 2015-12-21 LAB — LIPASE, BLOOD: Lipase: 44 U/L (ref 11–51)

## 2015-12-21 LAB — URINE MICROSCOPIC-ADD ON

## 2015-12-21 LAB — POC OCCULT BLOOD, ED: FECAL OCCULT BLD: NEGATIVE

## 2015-12-21 MED ORDER — IOPAMIDOL (ISOVUE-300) INJECTION 61%
INTRAVENOUS | Status: AC
  Administered 2015-12-21: 100 mL
  Filled 2015-12-21: qty 100

## 2015-12-21 MED ORDER — SODIUM CHLORIDE 0.9 % IV BOLUS (SEPSIS)
1000.0000 mL | Freq: Once | INTRAVENOUS | Status: AC
  Administered 2015-12-21: 1000 mL via INTRAVENOUS

## 2015-12-21 MED ORDER — ACETAMINOPHEN 500 MG PO TABS
1000.0000 mg | ORAL_TABLET | Freq: Once | ORAL | Status: DC
  Filled 2015-12-21: qty 2

## 2015-12-21 MED ORDER — METRONIDAZOLE 500 MG PO TABS: 500.0000 mg | ORAL_TABLET | Freq: Two times a day (BID) | ORAL | 0 refills | Status: DC

## 2015-12-21 MED ORDER — CIPROFLOXACIN HCL 500 MG PO TABS: 500.0000 mg | ORAL_TABLET | Freq: Two times a day (BID) | ORAL | 0 refills | Status: DC

## 2015-12-21 MED ORDER — MORPHINE SULFATE (PF) 4 MG/ML IV SOLN
4.0000 mg | Freq: Once | INTRAVENOUS | Status: AC
  Administered 2015-12-21: 4 mg via INTRAVENOUS
  Filled 2015-12-21: qty 1

## 2015-12-21 NOTE — ED Provider Notes (Signed)
[facility]-EMERGENCY DEPT Provider Note   CSN: [id number] Arrival date & time: 12/21/15  1219     History   Chief Complaint Chief Complaint  Patient presents with  . Abdominal Pain  . Hematuria/ blood in stool    HPI Harold Freeman is a 45 y.o. male with history of diabetes, GERD, polycystic kidney disease on dialysis MWF who presents with a two-week history of intermittent hematuria as well as blood in semen, intermittent abdominal pain, and a 2 day history of dark stools. Patient reports that he has had blood tinted urine and semen, but has not had symptoms in the past 3-4 days. He states it has not been every time he urinates. Patient reports that he has right sided, dull abdominal pain that is not associated with eating. He is not currently having abdominal pain. He reports it was last severe last evening. Patient reports having black, solid stools every bowel movement for the past 2 days. Patient has shortness of breath at baseline. He denies any fevers, chest pain, new urinary symptoms. Patient reports that he is on a new medicine, Auryxia, beginning Monday. He reports that he read a side effect that it could cause dark stool.  HPI  Past Medical History:  Diagnosis Date  . Bronchitis    hx of  . Diabetes mellitus   . Dialysis patient (HCC)   . GERD (gastroesophageal reflux disease)   . Gout   . Hyperlipidemia   . Hypertension   . Kidney stones   . Obesity   . Palpitations   . Polycystic kidney disease    ESRD w/ fistula; waiting on dialysis  . Tobacco abuse     Patient Active Problem List   Diagnosis Date Noted  . Diabetes (HCC) 01/13/2015  . Hyperlipidemia 01/13/2015  . Nonischemic cardiomyopathy (HCC) 01/13/2015  . Palpitations 01/13/2015  . Morbid obesity (HCC) 11/30/2011  . HTN (hypertension) 11/30/2011  . End stage renal disease (HCC) 04/16/2011    Past Surgical History:  Procedure Laterality Date  . AV FISTULA PLACEMENT  05/18/2011   Procedure: ARTERIOVENOUS  (AV) FISTULA CREATION;  Surgeon: Nada Libman, MD;  Location: [facility] OR;  Service: Vascular;  Laterality: Left;  Left wrist fistula; ultrasound guided.  [place]     as a teenager  . CARDIAC CATHETERIZATION    . KNEE ARTHROSCOPY W/ ACL RECONSTRUCTION    . LEFT HEART CATHETERIZATION WITH CORONARY ANGIOGRAM N/A 07/04/2012   Procedure: LEFT HEART CATHETERIZATION WITH CORONARY ANGIOGRAM;  Surgeon: Dolores Patty, MD;  Location: [facility] CATH LAB;  Service: Cardiovascular;  Laterality: N/A;       Home Medications    Prior to Admission medications   Medication Sig Start Date End Date Taking? Authorizing Provider  allopurinol (ZYLOPRIM) 300 MG tablet Take 300 mg by mouth daily.   Yes Historical Provider, MD  aspirin 81 MG tablet Take 81 mg by mouth daily.   Yes Historical Provider, MD  buPROPion (WELLBUTRIN XL) 150 MG 24 hr tablet Take 150 mg by mouth daily.   Yes Historical Provider, MD  calcium acetate (PHOSLO) 667 MG capsule Take 2,001 mg by mouth 3 (three) times daily with meals.  06/08/11  Yes Historical Provider, MD  carvedilol (COREG) 6.25 MG tablet Take 6.25 mg by mouth 2 (two) times daily with a meal.   Yes Historical Provider, MD  CHANTIX 0.5 MG tablet Take 0.5 mg by mouth daily. 11/21/15  Yes Historical Provider, MD  diphenhydramine-acetaminophen (TYLENOL PM) 25-500 MG TABS tablet  Take 1 tablet by mouth at bedtime as needed.   Yes Historical Provider, MD  Ferric Citrate (AURYXIA) 1 GM 210 MG(Fe) TABS Take 2-3 tablets by mouth 3 (three) times daily.   Yes Historical Provider, MD  glimepiride (AMARYL) 4 MG tablet Take 4 mg by mouth daily before breakfast.   Yes Historical Provider, MD  losartan (COZAAR) 100 MG tablet Take 100 mg by mouth daily.    Yes Historical Provider, MD  multivitamin (RENA-VIT) TABS tablet Take 1 tablet by mouth daily.   Yes Historical Provider, MD  omeprazole (PRILOSEC) 20 MG capsule Take 20 mg by mouth daily.   Yes Historical Provider, MD  rOPINIRole (REQUIP)  0.25 MG tablet Take 0.25 mg by mouth at bedtime. 11/21/15  Yes Historical Provider, MD  simvastatin (ZOCOR) 20 MG tablet Take 20 mg by mouth every evening.   Yes Historical Provider, MD  ciprofloxacin (CIPRO) 500 MG tablet Take 1 tablet (500 mg total) by mouth 2 (two) times daily. 12/21/15   Emi Holes, PA-C  metroNIDAZOLE (FLAGYL) 500 MG tablet Take 1 tablet (500 mg total) by mouth 2 (two) times daily. 12/21/15   Emi Holes, PA-C    Family History Family History  Problem Relation Age of Onset  . Diabetes Mother   . Hypertension Mother   . Kidney disease Mother     Polycystic kidney disease  . Cancer Father   . Diabetes Father   . Hypertension Father   . Diabetes Sister     Social History Social History  Substance Use Topics  . Smoking status: Current Every Day Smoker    Types: Cigarettes    Last attempt to quit: 05/11/2011  . Smokeless tobacco: Not on file     Comment: Trying to quit  . Alcohol use Yes     Comment: Used to be once a week but now very infrequently since starting dialysis 01/2012     Allergies   Mushroom extract complex   Review of Systems Review of Systems  Constitutional: Negative for chills and fever.  HENT: Negative for facial swelling and sore throat.   Respiratory: Negative for shortness of breath.   Cardiovascular: Negative for chest pain.  Gastrointestinal: Positive for abdominal pain and blood in stool (dark stools). Negative for constipation, diarrhea, nausea and vomiting.  Genitourinary: Negative for dysuria.  Musculoskeletal: Negative for back pain.  Skin: Negative for rash and wound.  Neurological: Negative for headaches.  Psychiatric/Behavioral: The patient is not nervous/anxious.      Physical Exam Updated Vital Signs BP (!) 175/106   Pulse 74   Temp 98 F (36.7 C) (Oral)   Resp 18   SpO2 97%   Physical Exam  Constitutional: He appears well-developed and well-nourished. No distress.  HENT:  Head: Normocephalic and  atraumatic.  Mouth/Throat: Oropharynx is clear and moist. No oropharyngeal exudate.  Eyes: Conjunctivae are normal. Pupils are equal, round, and reactive to light. Right eye exhibits no discharge. Left eye exhibits no discharge. No scleral icterus.  Neck: Normal range of motion. Neck supple. No thyromegaly present.  Cardiovascular: Normal rate, regular rhythm, normal heart sounds and intact distal pulses.  Exam reveals no gallop and no friction rub.   No murmur heard. Pulmonary/Chest: Effort normal and breath sounds normal. No stridor. No respiratory distress. He has no wheezes. He has no rales.  Abdominal: Soft. Bowel sounds are normal. He exhibits no distension. There is no tenderness. There is no rebound, no guarding and no CVA tenderness.  Genitourinary: Rectal exam shows no external hemorrhoid, no internal hemorrhoid, no mass, no tenderness and guaiac negative stool.  Musculoskeletal: He exhibits no edema.  Lymphadenopathy:    He has no cervical adenopathy.  Neurological: He is alert. Coordination normal.  Skin: Skin is warm and dry. No rash noted. He is not diaphoretic. No pallor.  Psychiatric: He has a normal mood and affect.  Nursing note and vitals reviewed.    ED Treatments / Results  Labs (all labs ordered are listed, but only abnormal results are displayed) Labs Reviewed  COMPREHENSIVE METABOLIC PANEL - Abnormal; Notable for the following:       Result Value   Potassium 5.2 (*)    Chloride 96 (*)    Glucose, Bld 127 (*)    BUN 50 (*)    Creatinine, Ser 13.01 (*)    GFR calc non Af Amer 4 (*)    GFR calc Af Amer 5 (*)    All other components within normal limits  CBC - Abnormal; Notable for the following:    RBC 3.53 (*)    Hemoglobin 11.1 (*)    HCT 33.1 (*)    All other components within normal limits  URINALYSIS, ROUTINE W REFLEX MICROSCOPIC (NOT [facility]) - Abnormal; Notable for the following:    Glucose, UA 250 (*)    Hgb urine dipstick SMALL (*)    Protein, ur  >300 (*)    All other components within normal limits  URINE MICROSCOPIC-ADD ON - Abnormal; Notable for the following:    Squamous Epithelial / LPF 0-5 (*)    Bacteria, UA RARE (*)    All other components within normal limits  LIPASE, BLOOD  OCCULT BLOOD X 1 CARD TO LAB, STOOL  POC OCCULT BLOOD, ED    EKG  EKG Interpretation None       Radiology Ct Abdomen Pelvis W Contrast  Result Date: 12/21/2015 CLINICAL DATA:  45 y/o M; intermittent abdominal pain and dark stool. Dialysis patient. EXAM: CT ABDOMEN AND PELVIS WITH CONTRAST TECHNIQUE: Multidetector CT imaging of the abdomen and pelvis was performed using the standard protocol following bolus administration of intravenous contrast. CONTRAST:  1 ISOVUE-300 IOPAMIDOL (ISOVUE-300) INJECTION 61% COMPARISON:  MRI of the abdomen dated 08/01/2013. CT abdomen dated is 08/01/2013. FINDINGS: Lower chest: Clear lungs.  Circumflex coronary calcification. Hepatobiliary: No focal liver abnormality is seen. No gallstones, gallbladder wall thickening, or biliary dilatation. Pancreas: Unremarkable. No pancreatic ductal dilatation or surrounding inflammatory changes. Spleen: Normal in size without focal abnormality. Adrenals/Urinary Tract: Normal adrenal glands. Polycystic kidneys. Several cyst demonstrate increased density compatible with hemorrhage. No hydronephrosis. Normal bladder. Stomach/Bowel: Normal stomach. No obstructive changes of bowel. Extensive sigmoid diverticulosis. Short segment of sigmoid colon with surrounding pericolonic inflammatory changes compatible with focal colitis/ diverticulitis. No evidence for perforation or abscess. Vascular/Lymphatic: Aortic atherosclerosis. No enlarged abdominal or pelvic lymph nodes. Reproductive: Prostate is unremarkable. Other: No abdominal wall hernia or abnormality. No abdominopelvic ascites. Musculoskeletal: No acute osseous abnormality. Degenerative changes of the lumbar spine greatest at the L4-5 level  with there are inferior endplate L4 Schmorl's nodes. IMPRESSION: 1. Focal sigmoid diverticulitis. 2. Aortic atherosclerosis. 3. Polycystic kidney disease. No discrete mass identified. No hydronephrosis. Electronically Signed   By: Mitzi HansenLance  Furusawa-Stratton M.D.   On: 12/21/2015 18:46    Procedures Procedures (including critical care time)  Medications Ordered in ED Medications  iopamidol (ISOVUE-300) 61 % injection (100 mLs  Contrast Given 12/21/15 1815)  sodium chloride 0.9 % bolus 1,000  mL (1,000 mLs Intravenous New Bag/Given 12/21/15 1908)  morphine 4 MG/ML injection 4 mg (4 mg Intravenous Given 12/21/15 1905)     Initial Impression / Assessment and Plan / ED Course  I have reviewed the triage vital signs and the nursing notes.  Pertinent labs & imaging results that were available during my care of the patient were reviewed by me and considered in my medical decision making (see chart for details).  Clinical Course     Patient with stable chronic anemia, hemoglobin 11.1. CMP shows potassium 5.2, chloride 96, creatinine 13.01, BUN 50. (Patient is on dialysis). UA shows glucose 250, small hematuria, protein >300, rare bacteria. Fecal occult negative. CT abdomen pelvis shows focal sigmoid diverticulitis, aortic atherosclerosis, polycystic kidney disease, no discrete mass identified or hydronephrosis. Patient to be discharged with Cipro and Flagyl with follow-up to GI as needed. Also follow-up to urology within 1 week. Return precautions discussed. Patient understands and agrees with plan. Questions answered. Patient stable and discharged in satisfactory condition. Patient also evaluated by Dr. Jeraldine Loots who guided the patient's management and agrees with plan.  Final Clinical Impressions(s) / ED Diagnoses   Final diagnoses:  Diverticulitis of large intestine without perforation or abscess without bleeding  Hematuria, unspecified type    New Prescriptions New Prescriptions    CIPROFLOXACIN (CIPRO) 500 MG TABLET    Take 1 tablet (500 mg total) by mouth 2 (two) times daily.   METRONIDAZOLE (FLAGYL) 500 MG TABLET    Take 1 tablet (500 mg total) by mouth 2 (two) times daily.     Emi Holes, PA-C 12/21/15 1952    Gerhard Munch, MD 12/22/15 0020

## 2015-12-21 NOTE — ED Triage Notes (Signed)
Patient complains of several days of abdominal discomfort with hematuria and dark stools. On arrival no abdominal pain. States that he was recently treated 2 weeks ago for UTI and has finished antibiotics. Due for dialysis today. NAD

## 2015-12-21 NOTE — Discharge Instructions (Signed)
Medications: Cipro, Flagyl  Treatment: Take Cipro and Flagyl as prescribed for 10 days. You can take Tylenol as prescribed over-the-counter for your pain.  Follow-up: Please follow-up with the urologist, Dr. Sherron Monday, within one week for further evaluation and treatment of the blood in your urine and semen. Please follow-up with gastroenterology for further evaluation if your abdominal symptoms do not improve. Please return to the emergency department if you develop any new or worsening symptoms.

## 2015-12-21 NOTE — ED Notes (Signed)
Patient on continuous pulse oximetry and blood pressure cuff; visitor at bedside

## 2016-05-10 ENCOUNTER — Emergency Department (HOSPITAL_BASED_OUTPATIENT_CLINIC_OR_DEPARTMENT_OTHER)
Admission: EM | Admit: 2016-05-10 | Discharge: 2016-05-10 | Disposition: A | Discharge status: Home / Self Care | Payer: Medicare Other | Attending: Emergency Medicine | Admitting: Emergency Medicine

## 2016-05-10 ENCOUNTER — Encounter (HOSPITAL_BASED_OUTPATIENT_CLINIC_OR_DEPARTMENT_OTHER): Payer: Self-pay | Admitting: Emergency Medicine

## 2016-05-10 DIAGNOSIS — R51 Headache: Secondary | ICD-10-CM | POA: Diagnosis not present

## 2016-05-10 DIAGNOSIS — Y732 Prosthetic and other implants, materials and accessory gastroenterology and urology devices associated with adverse incidents: Secondary | ICD-10-CM | POA: Diagnosis not present

## 2016-05-10 DIAGNOSIS — I120 Hypertensive chronic kidney disease with stage 5 chronic kidney disease or end stage renal disease: Secondary | ICD-10-CM | POA: Insufficient documentation

## 2016-05-10 DIAGNOSIS — E1122 Type 2 diabetes mellitus with diabetic chronic kidney disease: Secondary | ICD-10-CM | POA: Diagnosis not present

## 2016-05-10 DIAGNOSIS — T82838A Hemorrhage of vascular prosthetic devices, implants and grafts, initial encounter: Secondary | ICD-10-CM

## 2016-05-10 DIAGNOSIS — Z7984 Long term (current) use of oral hypoglycemic drugs: Secondary | ICD-10-CM | POA: Diagnosis not present

## 2016-05-10 DIAGNOSIS — Z87891 Personal history of nicotine dependence: Secondary | ICD-10-CM | POA: Insufficient documentation

## 2016-05-10 DIAGNOSIS — Z7982 Long term (current) use of aspirin: Secondary | ICD-10-CM | POA: Diagnosis not present

## 2016-05-10 DIAGNOSIS — N186 End stage renal disease: Secondary | ICD-10-CM | POA: Diagnosis not present

## 2016-05-10 NOTE — ED Triage Notes (Signed)
Had HD last night and has been having constant bleeding from AVF, no prior episode . AVF placed x 4 years ago. Also c/o of sinus h/a since Sunday, has been taking tylenol and Claritin

## 2016-05-10 NOTE — Discharge Instructions (Signed)
Need to keep dressing in place till later today.  If bleeding reoccurs can hold direct pressure or place a small piece of gauze and wrap tape around the arm to apply direct pressure.

## 2016-05-10 NOTE — ED Notes (Signed)
Scant amt of blood oozing from AVF to left arm, 4x4 applied with coban wrap, ED MD at bedside

## 2016-05-10 NOTE — ED Provider Notes (Signed)
[facility] Provider Note   CSN: [id number] Arrival date & time: 05/10/16  1478     History   Chief Complaint Chief Complaint  Patient presents with  . Vascular Access Problem    HPI Harold Freeman is a 46 y.o. male.  Pt is a 45y/o with male with ESRD on dialysis, DM, HTN, HLD who does not take oral anticoagulants but does get heparin with dialysis presenting with persistent bleeding from dialysis graft.  Pt had dialysis yesterday and continues to ooze from the graft.  He oozed last night until today.  They had to switch the bandage 3 times.  Pt denies any numbness or weakness in his hand.  No pain and states no chronic scabs.   The history is provided by the patient.    Past Medical History:  Diagnosis Date  . Bronchitis    hx of  . Diabetes mellitus   . Dialysis patient (HCC)   . GERD (gastroesophageal reflux disease)   . Gout   . Hyperlipidemia   . Hypertension   . Kidney stones   . Obesity   . Palpitations   . Polycystic kidney disease    ESRD w/ fistula; waiting on dialysis  . Tobacco abuse     Patient Active Problem List   Diagnosis Date Noted  . Diabetes (HCC) 01/13/2015  . Hyperlipidemia 01/13/2015  . Nonischemic cardiomyopathy (HCC) 01/13/2015  . Palpitations 01/13/2015  . Morbid obesity (HCC) 11/30/2011  . HTN (hypertension) 11/30/2011  . End stage renal disease (HCC) 04/16/2011    Past Surgical History:  Procedure Laterality Date  . AV FISTULA PLACEMENT  05/18/2011   Procedure: ARTERIOVENOUS (AV) FISTULA CREATION;  Surgeon: Nada Libman, MD;  Location: [facility];  Service: Vascular;  Laterality: Left;  Left wrist fistula; ultrasound guided.  Arbutus Leas     as a teenager  . CARDIAC CATHETERIZATION    . KNEE ARTHROSCOPY W/ ACL RECONSTRUCTION    . LEFT HEART CATHETERIZATION WITH CORONARY ANGIOGRAM N/A 07/04/2012   Procedure: LEFT HEART CATHETERIZATION WITH CORONARY ANGIOGRAM;  Surgeon: Dolores Patty, MD;  Location: [facility] CATH LAB;   Service: Cardiovascular;  Laterality: N/A;       Home Medications    Prior to Admission medications   Medication Sig Start Date End Date Taking? Authorizing Provider  allopurinol (ZYLOPRIM) 300 MG tablet Take 300 mg by mouth daily.   Yes Historical Provider, MD  aspirin 81 MG tablet Take 81 mg by mouth daily.   Yes Historical Provider, MD  buPROPion (WELLBUTRIN XL) 150 MG 24 hr tablet Take 150 mg by mouth daily.   Yes Historical Provider, MD  calcium acetate (PHOSLO) 667 MG capsule Take 2,001 mg by mouth 3 (three) times daily with meals.  06/08/11  Yes Historical Provider, MD  carvedilol (COREG) 6.25 MG tablet Take 6.25 mg by mouth 2 (two) times daily with a meal.   Yes Historical Provider, MD  diphenhydramine-acetaminophen (TYLENOL PM) 25-500 MG TABS tablet Take 1 tablet by mouth at bedtime as needed.   Yes Historical Provider, MD  Ferric Citrate (AURYXIA) 1 GM 210 MG(Fe) TABS Take 2-3 tablets by mouth 3 (three) times daily.   Yes Historical Provider, MD  glimepiride (AMARYL) 4 MG tablet Take 4 mg by mouth daily before breakfast.   Yes Historical Provider, MD  losartan (COZAAR) 100 MG tablet Take 100 mg by mouth daily.    Yes Historical Provider, MD  multivitamin (RENA-VIT) TABS tablet Take 1 tablet by mouth  daily.   Yes Historical Provider, MD  omeprazole (PRILOSEC) 20 MG capsule Take 20 mg by mouth daily.   Yes Historical Provider, MD  rOPINIRole (REQUIP) 0.25 MG tablet Take 0.25 mg by mouth at bedtime. 11/21/15  Yes Historical Provider, MD  simvastatin (ZOCOR) 20 MG tablet Take 20 mg by mouth every evening.   Yes Historical Provider, MD  CHANTIX 0.5 MG tablet Take 0.5 mg by mouth daily. 11/21/15   Historical Provider, MD  ciprofloxacin (CIPRO) 500 MG tablet Take 1 tablet (500 mg total) by mouth 2 (two) times daily. 12/21/15   Emi Holes, PA-C  metroNIDAZOLE (FLAGYL) 500 MG tablet Take 1 tablet (500 mg total) by mouth 2 (two) times daily. 12/21/15   Emi Holes, PA-C    Family  History Family History  Problem Relation Age of Onset  . Diabetes Mother   . Hypertension Mother   . Kidney disease Mother     Polycystic kidney disease  . Cancer Father   . Diabetes Father   . Hypertension Father   . Diabetes Sister     Social History Social History  Substance Use Topics  . Smoking status: Former Smoker    Quit date: 05/11/2011  . Smokeless tobacco: Never Used     Comment: Trying to quit  . Alcohol use No     Comment: Used to be once a week but now very infrequently since starting dialysis 01/2012     Allergies   Mushroom extract complex   Review of Systems Review of Systems  HENT: Positive for congestion.        Has been outside doing yard work and having congestion similar to allergies.  Neurological: Positive for headaches.       Related to allergy congestion  All other systems reviewed and are negative.    Physical Exam Updated Vital Signs BP (!) 164/108   Pulse 97   Temp 97.8 F (36.6 C) (Oral)   Resp 18   Ht 6\' 1"  (1.854 m)   Wt 270 lb (122.5 kg)   SpO2 93%   BMI 35.62 kg/m   Physical Exam  Constitutional: He is oriented to person, place, and time. He appears well-developed and well-nourished. No distress.  HENT:  Head: Normocephalic and atraumatic.  Eyes: EOM are normal. Pupils are equal, round, and reactive to light.  Cardiovascular: Normal rate.   Pulmonary/Chest: Effort normal.  Musculoskeletal:  Graft in the left forearm.  Bandage removed no bleeding currently.  Thrill in graft  Neurological: He is alert and oriented to person, place, and time.  Skin: Skin is warm and dry.  Psychiatric: He has a normal mood and affect. His behavior is normal.  Nursing note and vitals reviewed.    ED Treatments / Results  Labs (all labs ordered are listed, but only abnormal results are displayed) Labs Reviewed - No data to display  EKG  EKG Interpretation None       Radiology No results found.  Procedures Procedures  (including critical care time)  Medications Ordered in ED Medications - No data to display   Initial Impression / Assessment and Plan / ED Course  I have reviewed the triage vital signs and the nursing notes.  Pertinent labs & imaging results that were available during my care of the patient were reviewed by me and considered in my medical decision making (see chart for details).    Patient presents today for persistent oozing from his dialysis graft. He had had to  change the bandage multiple times from dialysis yesterday. He takes no anticoagulation but does get heparin with dialysis. When bandage removed here the bleeding has subsided. New dressing applied and will observe to ensure no recurrent bleeding.  Final Clinical Impressions(s) / ED Diagnoses   Final diagnoses:  Bleeding from dialysis shunt, initial encounter [facility])    New Prescriptions New Prescriptions   No medications on file     Gwyneth Sprout, MD 05/10/16 [phone]

## 2016-06-22 ENCOUNTER — Institutional Professional Consult (permissible substitution): Payer: Medicare Other | Admitting: Pulmonary Disease

## 2016-07-31 ENCOUNTER — Encounter: Payer: Self-pay | Admitting: Internal Medicine

## 2016-07-31 ENCOUNTER — Ambulatory Visit (INDEPENDENT_AMBULATORY_CARE_PROVIDER_SITE_OTHER): Payer: Medicare Other | Admitting: Internal Medicine

## 2016-07-31 VITALS — BP 122/72 | HR 75 | Ht 72.5 in | Wt 269.0 lb

## 2016-07-31 DIAGNOSIS — N186 End stage renal disease: Secondary | ICD-10-CM | POA: Diagnosis not present

## 2016-07-31 DIAGNOSIS — G4733 Obstructive sleep apnea (adult) (pediatric): Secondary | ICD-10-CM | POA: Diagnosis not present

## 2016-07-31 DIAGNOSIS — I428 Other cardiomyopathies: Secondary | ICD-10-CM

## 2016-07-31 DIAGNOSIS — R040 Epistaxis: Secondary | ICD-10-CM

## 2016-07-31 NOTE — Assessment & Plan Note (Signed)
He reports recurrent wintertime epistaxis apparently with drying. There is some septal deviation with narrowing on the right and some visible blood in the right nostril today. He breathes comfortably with his mouth closed. Plan-consider ENT evaluation. We discussed use of saline nasal spray and saline nasal gel to protect nasal mucosa if needed.

## 2016-07-31 NOTE — Patient Instructions (Signed)
Order- schedule unattended home sleep test    Dx OSA  My staff will work with you to set a follow-up visit based on results.

## 2016-07-31 NOTE — Progress Notes (Signed)
07/31/16-46 year old male former smoker referred courtesy of Dr Fayrene Fearing [place] and [facility] Transplant team; never had sleep study. Does not sleep well through night. Denies any gasping for air but has heard himself snore. Medical problems include bronchitis, DM 2, ESRD, GERD, HBP He is being evaluated for kidney transplant and they're screening identified him as at risk for OSA. He says part of the concern is "my heart muscle is weak". He describes fragmented sleep with frequent waking, helped by trazodone. Aware that he snores but has not been told that he stops breathing. Admits daytime sleepiness, part of which is especially evident right after dialysis. No ENT surgery or lung disease. He quit smoking last year. Tends to get epistaxis each winter, especially on dialysis days, suggesting drying. Weight is down about 15 pounds from last year.  Prior to Admission medications   Medication Sig Start Date End Date Taking? Authorizing Provider  allopurinol (ZYLOPRIM) 300 MG tablet Take 300 mg by mouth daily.   Yes [provider]  aspirin 81 MG tablet Take 81 mg by mouth daily.   Yes [provider]  buPROPion (WELLBUTRIN XL) 150 MG 24 hr tablet Take 150 mg by mouth daily.   Yes [provider]  calcium acetate (PHOSLO) 667 MG capsule Take 2,001 mg by mouth 3 (three) times daily with meals.  06/08/11  Yes [provider]  carvedilol (COREG) 6.25 MG tablet M, W, F take 2 tablets and all other days take 3 tablets   Yes [provider]  diphenhydramine-acetaminophen (TYLENOL PM) 25-500 MG TABS tablet Take 1 tablet by mouth at bedtime as needed.   Yes [provider]  Ferric Citrate (AURYXIA) 1 GM 210 MG(Fe) TABS Take 2-3 tablets by mouth 3 (three) times daily.   Yes [provider]  glimepiride (AMARYL) 4 MG tablet Take 4 mg by mouth daily before breakfast.   Yes [provider]  losartan (COZAAR) 100 MG tablet Take 100 mg by  mouth daily.    Yes [provider]  multivitamin (RENA-VIT) TABS tablet Take 1 tablet by mouth daily.   Yes [provider]  omeprazole (PRILOSEC) 20 MG capsule Take 20 mg by mouth daily.   Yes [provider]  rOPINIRole (REQUIP) 0.25 MG tablet Take 0.25 mg by mouth at bedtime. 11/21/15  Yes [provider]  simvastatin (ZOCOR) 20 MG tablet Take 20 mg by mouth every evening.   Yes [provider]   Past Medical History:  Diagnosis Date  . Bronchitis    hx of  . Diabetes mellitus   . Dialysis patient (HCC)   . GERD (gastroesophageal reflux disease)   . Gout   . Hyperlipidemia   . Hypertension   . Kidney stones   . Obesity   . Palpitations   . Polycystic kidney disease    ESRD w/ fistula; waiting on dialysis  . Tobacco abuse    Past Surgical History:  Procedure Laterality Date  . AV FISTULA PLACEMENT  05/18/2011   Procedure: ARTERIOVENOUS (AV) FISTULA CREATION;  Surgeon: Nada Libman, MD;  Location: [facility] OR;  Service: Vascular;  Laterality: Left;  Left wrist fistula; ultrasound guided.  Arbutus Leas     as a teenager  . CARDIAC CATHETERIZATION    . KNEE ARTHROSCOPY W/ ACL RECONSTRUCTION    . LEFT HEART CATHETERIZATION WITH CORONARY ANGIOGRAM N/A 07/04/2012   Procedure: LEFT HEART CATHETERIZATION WITH CORONARY ANGIOGRAM;  Surgeon: Dolores Patty, MD;  Location: [facility] CATH LAB;  Service: Cardiovascular;  Laterality: N/A;   Family History  Problem Relation Age of Onset  . Diabetes Mother   . Hypertension Mother   . Kidney disease Mother        Polycystic kidney disease  . Cancer Father   . Diabetes Father   . Hypertension Father   . Diabetes Sister    Social History   Social History  . Marital status: Single    Spouse name: N/A  . Number of children: N/A  . Years of education: N/A   Occupational History  . Not on file.   Social History Main Topics  . Smoking status: Former Smoker    Quit date: 05/11/2011  .  Smokeless tobacco: Never Used     Comment: Trying to quit  . Alcohol use No     Comment: Used to be once a week but now very infrequently since starting dialysis 01/2012  . Drug use: No  . Sexual activity: Not on file   Other Topics Concern  . Not on file   Social History Narrative  . No narrative on file    ROS-see HPI   + = pos Constitutional:    weight loss, night sweats, fevers, chills, + fatigue, lassitude. HEENT:    headaches, difficulty swallowing, tooth/dental problems, sore throat,       sneezing, itching, ear ache, nasal congestion, post nasal drip, snoring CV:    chest pain, orthopnea, PND, swelling in lower extremities, anasarca,                                                    dizziness, palpitations Resp:   shortness of breath with exertion or at rest.                productive cough,   non-productive cough, coughing up of blood.              change in color of mucus.  wheezing.   Skin:    rash or lesions. GI:  No-   heartburn, indigestion, abdominal pain, nausea, vomiting, diarrhea,                 change in bowel habits, loss of appetite GU: dysuria, change in color of urine, no urgency or frequency.   flank pain. MS:   joint pain, stiffness, decreased range of motion, back pain. Neuro-     nothing unusual Psych:  change in mood or affect.  depression or anxiety.   memory loss.  OBJ- Physical Exam General- Alert, Oriented, Affect-appropriate, Distress- none acute, + overweight Skin- rash-none, lesions- none, excoriation- none Lymphadenopathy- none Head- atraumatic            Eyes- Gross vision intact, PERRLA, conjunctivae and secretions clear            Ears- Hearing, canals-normal            Nose-  +Septal dev, + bloody mucosa right nostril , no-polyps, erosion, perforation             Throat- Mallampati III , mucosa clear , drainage- none, tonsils- atrophic Neck- flexible , trachea midline, no stridor , thyroid nl, carotid no bruit Chest - symmetrical  excursion , unlabored           Heart/CV- RRR , no murmur , no gallop  , no  rub, nl s1 s2                           - JVD- none , edema- none, stasis changes- none, varices- none           Lung- clear to P&A, wheeze- none, cough- none , dullness-none, rub- none           Chest wall-  Abd-  Br/ Gen/ Rectal- Not done, not indicated Extrem- cyanosis- none, clubbing, none, atrophy- none, strength- nl. + Dialysis access left forearm Neuro- grossly intact to observation

## 2016-07-31 NOTE — Assessment & Plan Note (Signed)
Probable diagnosis based on history and physical. We discussed basic sleep hygiene, the medical issues of obstructive sleep apnea, his responsibility to drive safely, calm and treatment considerations. Questions answered. Plan-schedule sleep study

## 2016-07-31 NOTE — Assessment & Plan Note (Signed)
He reports "my heart is weak", followed by program at [facility]

## 2016-07-31 NOTE — Assessment & Plan Note (Signed)
Currently on dialysis but being evaluated for renal transplant, followed at [facility]

## 2016-08-09 DIAGNOSIS — G4733 Obstructive sleep apnea (adult) (pediatric): Secondary | ICD-10-CM

## 2016-08-10 ENCOUNTER — Other Ambulatory Visit: Payer: Self-pay | Admitting: *Deleted

## 2016-08-10 DIAGNOSIS — G4733 Obstructive sleep apnea (adult) (pediatric): Secondary | ICD-10-CM

## 2016-10-25 ENCOUNTER — Emergency Department (HOSPITAL_COMMUNITY)
Admission: EM | Admit: 2016-10-25 | Discharge: 2016-10-25 | Disposition: A | Discharge status: Home / Self Care | Payer: Medicare Other | Attending: Emergency Medicine | Admitting: Emergency Medicine

## 2016-10-25 ENCOUNTER — Encounter (HOSPITAL_COMMUNITY): Payer: Self-pay | Admitting: Emergency Medicine

## 2016-10-25 ENCOUNTER — Emergency Department (HOSPITAL_COMMUNITY): Payer: Medicare Other

## 2016-10-25 DIAGNOSIS — K8020 Calculus of gallbladder without cholecystitis without obstruction: Secondary | ICD-10-CM | POA: Diagnosis not present

## 2016-10-25 DIAGNOSIS — Z7982 Long term (current) use of aspirin: Secondary | ICD-10-CM | POA: Insufficient documentation

## 2016-10-25 DIAGNOSIS — I120 Hypertensive chronic kidney disease with stage 5 chronic kidney disease or end stage renal disease: Secondary | ICD-10-CM | POA: Diagnosis not present

## 2016-10-25 DIAGNOSIS — Z87891 Personal history of nicotine dependence: Secondary | ICD-10-CM | POA: Diagnosis not present

## 2016-10-25 DIAGNOSIS — Z79899 Other long term (current) drug therapy: Secondary | ICD-10-CM | POA: Diagnosis not present

## 2016-10-25 DIAGNOSIS — E1122 Type 2 diabetes mellitus with diabetic chronic kidney disease: Secondary | ICD-10-CM | POA: Diagnosis not present

## 2016-10-25 DIAGNOSIS — R1011 Right upper quadrant pain: Secondary | ICD-10-CM

## 2016-10-25 DIAGNOSIS — N186 End stage renal disease: Secondary | ICD-10-CM | POA: Diagnosis not present

## 2016-10-25 LAB — COMPREHENSIVE METABOLIC PANEL
ALBUMIN: 3.9 g/dL (ref 3.5–5.0)
ALT: 25 U/L (ref 17–63)
ANION GAP: 14 (ref 5–15)
AST: 26 U/L (ref 15–41)
Alkaline Phosphatase: 69 U/L (ref 38–126)
BILIRUBIN TOTAL: 0.7 mg/dL (ref 0.3–1.2)
BUN: 24 mg/dL — ABNORMAL HIGH (ref 6–20)
CO2: 29 mmol/L (ref 22–32)
Calcium: 8.9 mg/dL (ref 8.9–10.3)
Chloride: 93 mmol/L — ABNORMAL LOW (ref 101–111)
Creatinine, Ser: 8.05 mg/dL — ABNORMAL HIGH (ref 0.61–1.24)
GFR calc non Af Amer: 7 mL/min — ABNORMAL LOW (ref >60)
GFR, EST AFRICAN AMERICAN: 8 mL/min — AB (ref >60)
GLUCOSE: 179 mg/dL — AB (ref 65–99)
POTASSIUM: 4.3 mmol/L (ref 3.5–5.1)
SODIUM: 136 mmol/L (ref 135–145)
TOTAL PROTEIN: 8 g/dL (ref 6.5–8.1)

## 2016-10-25 LAB — CBC
HEMATOCRIT: 38.4 % — AB (ref 39.0–52.0)
HEMOGLOBIN: 13 g/dL (ref 13.0–17.0)
MCH: 32.4 pg (ref 26.0–34.0)
MCHC: 33.9 g/dL (ref 30.0–36.0)
MCV: 95.8 fL (ref 78.0–100.0)
Platelets: 150 10*3/uL (ref 150–400)
RBC: 4.01 MIL/uL — AB (ref 4.22–5.81)
RDW: 14 % (ref 11.5–15.5)
WBC: 6 10*3/uL (ref 4.0–10.5)

## 2016-10-25 LAB — URINALYSIS, ROUTINE W REFLEX MICROSCOPIC
BILIRUBIN URINE: NEGATIVE
Glucose, UA: 150 mg/dL — AB
KETONES UR: NEGATIVE mg/dL
Leukocytes, UA: NEGATIVE
NITRITE: NEGATIVE
PROTEIN: 100 mg/dL — AB
SPECIFIC GRAVITY, URINE: 1.008 (ref 1.005–1.030)
pH: 9 — ABNORMAL HIGH (ref 5.0–8.0)

## 2016-10-25 LAB — LIPASE, BLOOD: Lipase: 68 U/L — ABNORMAL HIGH (ref 11–51)

## 2016-10-25 MED ORDER — ONDANSETRON 4 MG PO TBDP
4.0000 mg | ORAL_TABLET | Freq: Once | ORAL | Status: AC | PRN
  Administered 2016-10-25: 4 mg via ORAL

## 2016-10-25 MED ORDER — HYDROCODONE-ACETAMINOPHEN 5-325 MG PO TABS: 1.0000 | ORAL_TABLET | Freq: Four times a day (QID) | ORAL | 0 refills | Status: DC | PRN

## 2016-10-25 MED ORDER — ONDANSETRON 4 MG PO TBDP
ORAL_TABLET | ORAL | Status: AC
  Filled 2016-10-25: qty 1

## 2016-10-25 MED ORDER — HYDROCODONE-ACETAMINOPHEN 5-325 MG PO TABS
1.0000 | ORAL_TABLET | Freq: Once | ORAL | Status: AC
  Administered 2016-10-25: 1 via ORAL
  Filled 2016-10-25: qty 1

## 2016-10-25 NOTE — ED Notes (Signed)
Transported to ultrasound

## 2016-10-25 NOTE — ED Triage Notes (Signed)
Pt c/o right sided abdominal pain that woke him from sleep. Endorses nausea, denies vomiting/diarrhea. Denies urinary symptoms. Dialysis Mon/wed/fri.

## 2016-10-25 NOTE — ED Notes (Signed)
Returned from Ultrasound. No complaints.

## 2016-10-25 NOTE — ED Provider Notes (Signed)
[facility]-EMERGENCY DEPT Provider Note   CSN: [id number] Arrival date & time: 10/25/16  0153     History   Chief Complaint Chief Complaint  Patient presents with  . Abdominal Pain    HPI TARRIS BHAT is a 46 y.o. male.  The history is provided by the patient.  Abdominal Pain   This is a new problem. The current episode started 6 to 12 hours ago. The problem occurs constantly. The problem has been gradually improving. The pain is associated with an unknown factor. The pain is located in the RUQ. The quality of the pain is sharp, shooting and colicky. The pain is at a severity of 8/10. The pain is severe. Associated symptoms include anorexia and nausea. Pertinent negatives include fever, diarrhea, vomiting, constipation and dysuria. The symptoms are aggravated by deep breathing and activity. Nothing relieves the symptoms. Past medical history comments: no prior surgery.  hx of kidney stones but states this does not feel like a ks.    Past Medical History:  Diagnosis Date  . Bronchitis    hx of  . Diabetes mellitus   . Dialysis patient (HCC)   . GERD (gastroesophageal reflux disease)   . Gout   . Hyperlipidemia   . Hypertension   . Kidney stones   . Obesity   . Palpitations   . Polycystic kidney disease    ESRD w/ fistula; waiting on dialysis  . Tobacco abuse     Patient Active Problem List   Diagnosis Date Noted  . Obstructive sleep apnea 07/31/2016  . Epistaxis 07/31/2016  . Diabetes (HCC) 01/13/2015  . Hyperlipidemia 01/13/2015  . Nonischemic cardiomyopathy (HCC) 01/13/2015  . Palpitations 01/13/2015  . Morbid obesity (HCC) 11/30/2011  . HTN (hypertension) 11/30/2011  . End stage renal disease (HCC) 04/16/2011    Past Surgical History:  Procedure Laterality Date  . AV FISTULA PLACEMENT  05/18/2011   Procedure: ARTERIOVENOUS (AV) FISTULA CREATION;  Surgeon: Nada Libman, MD;  Location: [facility] OR;  Service: Vascular;  Laterality: Left;  Left wrist fistula; ultrasound  guided.  [place] Leas     as a teenager  . CARDIAC CATHETERIZATION    . KNEE ARTHROSCOPY W/ ACL RECONSTRUCTION    . LEFT HEART CATHETERIZATION WITH CORONARY ANGIOGRAM N/A 07/04/2012   Procedure: LEFT HEART CATHETERIZATION WITH CORONARY ANGIOGRAM;  Surgeon: Dolores Patty, MD;  Location: [facility] CATH LAB;  Service: Cardiovascular;  Laterality: N/A;       Home Medications    Prior to Admission medications   Medication Sig Start Date End Date Taking? Authorizing Provider  allopurinol (ZYLOPRIM) 300 MG tablet Take 300 mg by mouth daily.    [provider]  aspirin 81 MG tablet Take 81 mg by mouth daily.    [provider]  buPROPion (WELLBUTRIN XL) 150 MG 24 hr tablet Take 150 mg by mouth daily.    [provider]  calcium acetate (PHOSLO) 667 MG capsule Take 2,001 mg by mouth 3 (three) times daily with meals.  06/08/11   [provider]  carvedilol (COREG) 6.25 MG tablet M, W, F take 2 tablets and all other days take 3 tablets    [provider]  diphenhydramine-acetaminophen (TYLENOL PM) 25-500 MG TABS tablet Take 1 tablet by mouth at bedtime as needed.    [provider]  Ferric Citrate (AURYXIA) 1 GM 210 MG(Fe) TABS Take 2-3 tablets by mouth 3 (three) times daily.    [provider]  glimepiride (AMARYL) 4 MG  tablet Take 4 mg by mouth daily before breakfast.    [provider]  losartan (COZAAR) 100 MG tablet Take 100 mg by mouth daily.     [provider]  multivitamin (RENA-VIT) TABS tablet Take 1 tablet by mouth daily.    [provider]  omeprazole (PRILOSEC) 20 MG capsule Take 20 mg by mouth daily.    [provider]  rOPINIRole (REQUIP) 0.25 MG tablet Take 0.25 mg by mouth at bedtime. 11/21/15   [provider]  simvastatin (ZOCOR) 20 MG tablet Take 20 mg by mouth every evening.    [provider]    Family History Family History  Problem Relation Age of Onset  .  Diabetes Mother   . Hypertension Mother   . Kidney disease Mother        Polycystic kidney disease  . Cancer Father   . Diabetes Father   . Hypertension Father   . Diabetes Sister     Social History Social History  Substance Use Topics  . Smoking status: Former Smoker    Quit date: 05/11/2011  . Smokeless tobacco: Never Used     Comment: Trying to quit  . Alcohol use No     Comment: Used to be once a week but now very infrequently since starting dialysis 01/2012     Allergies   Mushroom extract complex   Review of Systems Review of Systems  Constitutional: Negative for fever.  Gastrointestinal: Positive for abdominal pain, anorexia and nausea. Negative for constipation, diarrhea and vomiting.  Genitourinary: Negative for dysuria.  All other systems reviewed and are negative.    Physical Exam Updated Vital Signs BP 132/87 (BP Location: Right Arm)   Pulse 71   Temp 98.3 F (36.8 C) (Oral)   Resp 17   Ht  (1.854 m)   Wt 122.5 kg (270 lb)   SpO2 93%   BMI 35.62 kg/m   Physical Exam  Constitutional: He is oriented to person, place, and time. He appears well-developed and well-nourished. No distress.  HENT:  Head: Normocephalic and atraumatic.  Mouth/Throat: Oropharynx is clear and moist.  Eyes: Pupils are equal, round, and reactive to light. Conjunctivae and EOM are normal.  Neck: Normal range of motion. Neck supple.  Cardiovascular: Normal rate, regular rhythm and intact distal pulses.   No murmur heard. Pulmonary/Chest: Effort normal and breath sounds normal. No respiratory distress. He has no wheezes. He has no rales.  Abdominal: Soft. He exhibits no distension. There is no hepatosplenomegaly. There is tenderness in the right upper quadrant. There is CVA tenderness and positive Murphy's sign. There is no rebound and no guarding.    Musculoskeletal: Normal range of motion. He exhibits no edema or tenderness.  Fistula present in the left wrist with  palpable thrill  Neurological: He is alert and oriented to person, place, and time.  Skin: Skin is warm and dry. No rash noted. No erythema.  Psychiatric: He has a normal mood and affect. His behavior is normal.  Nursing note and vitals reviewed.    ED Treatments / Results  Labs (all labs ordered are listed, but only abnormal results are displayed) Labs Reviewed  LIPASE, BLOOD - Abnormal; Notable for the following:       Result Value   Lipase 68 (*)    All other components within normal limits  COMPREHENSIVE METABOLIC PANEL - Abnormal; Notable for the following:    Chloride 93 (*)    Glucose, Bld 179 (*)  BUN 24 (*)    Creatinine, Ser 8.05 (*)    GFR calc non Af Amer 7 (*)    GFR calc Af Amer 8 (*)    All other components within normal limits  CBC - Abnormal; Notable for the following:    RBC 4.01 (*)    HCT 38.4 (*)    All other components within normal limits  URINALYSIS, ROUTINE W REFLEX MICROSCOPIC - Abnormal; Notable for the following:    APPearance HAZY (*)    pH 9.0 (*)    Glucose, UA 150 (*)    Hgb urine dipstick SMALL (*)    Protein, ur 100 (*)    Bacteria, UA RARE (*)    Squamous Epithelial / LPF 0-5 (*)    All other components within normal limits    EKG  EKG Interpretation None       Radiology US Abdomen Limited Ruq  Result Date: 10/25/2016 CLINICAL DATA:  Right upper quadrant pain. Adult polycystic kidney disease. EXAM: ULTRASOUND ABDOMEN LIMITED RIGHT UPPER QUADRANT COMPARISON:  CT abdomen and pelvis December 21, 2015 FINDINGS: Gallbladder: Within the gallbladder, there are occasional echogenic foci which move and shadow consistent with gallstones. Largest gallstone measures 4 mm in length. Gallbladder wall is mildly thickened and subtly edematous. No well-defined pericholecystic fluid evident. No sonographic Murphy sign noted by sonographer. Common bile duct: Diameter: 8 mm, mildly dilated. No intrahepatic biliary duct dilatation seen. No biliary duct  mass or calculus evident. Liver: No focal lesion identified. Liver echogenicity increased diffusely. Portal vein is patent on color Doppler imaging with normal direction of blood flow towards the liver. Innumerable cysts noted in right kidney. IMPRESSION: 1. Occasional small gallstones with mild gallbladder wall thickening. Gallbladder appears subtly edematous. These findings raise concern for a degree of cholecystitis. It may be prudent to correlate with nuclear medicine hepatobiliary imaging study to assess for cystic duct patency. 2. Mild prominence of the common bile duct at 8 mm. No mass or calculus evident. From an imaging standpoint, if further assessment of the common bile duct is felt to be advisable, MRCP would be the imaging study of choice for further assessment. 3. Increased liver echogenicity, a finding most likely due to hepatic steatosis. While no focal liver lesions are evident on this study, it must be cautioned that the sensitivity of ultrasound for detection of focal liver lesions is diminished in this circumstance. 4. Innumerable cysts in right kidney consistent with known adult polycystic kidney disease. Electronically Signed   By: Bretta Bang III M.D.   On: 10/25/2016 09:52    Procedures Procedures (including critical care time)  Medications Ordered in ED Medications  ondansetron (ZOFRAN-ODT) 4 MG disintegrating tablet (not administered)  HYDROcodone-acetaminophen (NORCO/VICODIN) 5-325 MG per tablet 1 tablet (not administered)  ondansetron (ZOFRAN-ODT) disintegrating tablet 4 mg (4 mg Oral Given 10/25/16 0209)     Initial Impression / Assessment and Plan / ED Course  I have reviewed the triage vital signs and the nursing notes.  Pertinent labs & imaging results that were available during my care of the patient were reviewed by me and considered in my medical decision making (see chart for details).     Patient is a 46 year old male with multiple medical problems  including end-stage renal disease on dialysis presenting with abrupt onset of abdominal pain around midnight. The pain is localized on the right and initially was 10 out of 10. After an extensive wait in the waiting room his pain has improved. It  is now 3-4 out of 10. He did get nausea medication in the front which is helped with his nausea. He denies having pain like this before. He does have history of kidney stones but states this did not feel like a kidney stone. He went to bed feeling normal. He denies any urinary symptoms or diarrhea. He has never undergone abdominal surgery. Last dialysis was yesterday he has not missed any treatments. He states initially the pain was so bad he was having trouble breathing but now the pain has improved he denies any respiratory or cardiac symptoms. Labs today significant for a lipase of 68, normal liver function tests and UA without blood in the urine. There are 6-30 white cells but he only produces a small amount of urine and low suspicion for pyelonephritis. Potential for kidney stone versus gallstones. Patient still has significant right upper quadrant pain on exam with some guarding and positive Murphy's sign. Patient given oral pain control. Ultrasound pending. If ultrasound is negative will do a CT.  10:09 AM Ultrasound is consistent with cholelithiasis. There is some mild gallbladder wall thickening but no pericolic cystic fluid. On reevaluation patient's pain has resolved. Feel that it is reasonable for patient to go home with outpatient follow-up for cholecystectomy. Patient and his wife given strict return precautions.  Final Clinical Impressions(s) / ED Diagnoses   Final diagnoses:  RUQ pain  Calculus of gallbladder without cholecystitis without obstruction    New Prescriptions New Prescriptions   HYDROCODONE-ACETAMINOPHEN (NORCO/VICODIN) 5-325 MG TABLET    Take 1-2 tablets by mouth every 6 (six) hours as needed for severe pain.     Gwyneth Sprout, MD 10/25/16 1011

## 2016-10-31 ENCOUNTER — Ambulatory Visit: Payer: Self-pay | Admitting: Surgery

## 2016-10-31 NOTE — H&P (Signed)
Harold Freeman 10/31/2016 1:38 PM Location: [facility] Patient #: [id number] DOB: Dec 06, 1970 Married / Language: English / Race: White Male  History of Present Illness (Chelsea A. Fredricka Bonine MD; 10/31/2016 2:02 PM) Patient words: This is a very nice 46 year old gentleman is referred for biliary colic. Last week he presented to the emergency department with an acute episode of right upper quadrant pain which was sharp and constant in quality. It woke him from sleep and lasted for 8-12 hours. Associated with nausea initially. No fevers. Did radiate to the side somewhat but no radiation to the epigastrium chest or back. He underwent an ultrasound of the right upper quadrant which demonstrated gallstones and some gallbladder wall thickening, common bile duct was 8 mm. His white count was 6, LFTs were normal. Lipase was 68. His pain improved for a left emergency department and so he is referred for outpatient follow-up. He has not had any subsequent episodes. He's had prior episodes of kidney stones but the pain from this is in his back and this was different. This was the first time he experienced this type of pain.  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 Former smoker. History of end-stage renal disease secondary to polycystic kidney disease and he is right on the transplant list. He is also diabetic. He has nonischemic cardiomyopathy with his most recent echo showing an EF of 40%. He states that he had a catheterization in 2013 did not show any major blockages. He is actually scheduled to have a nuclear stress test on the 16th of this month. No prior abdominal surgeries.  The patient is a 45 year old male.   Past Surgical History Christianne Dolin, [place]; 10/31/2016 1:38 PM) Dialysis Shunt / Fistula Foot Surgery Bilateral. Knee Surgery Right.  Diagnostic Studies History Christianne Dolin, [place]; 10/31/2016 1:38  PM) Colonoscopy never  Allergies Christianne Dolin, RMA; 10/31/2016 1:39 PM) No Known Allergies 10/31/2016  Medication History Christianne Dolin, [place]; 10/31/2016 1:41 PM) Allopurinol (  Tablet, Oral) Active. Rena-Vite (Oral) Active. BuPROPion HCl ER (SR) (  Tablet ER 12HR, Oral) Active. Carvedilol (  Tablet, Oral) Active. Hydrocodone-Acetaminophen (5-325MG  Tablet, Oral) Active. Calcium Acetate (Phos Binder) (  Capsule, Oral) Active. ROPINIRole HCl (0.25MG  Tablet, Oral) Active. Simvastatin (  Tablet, Oral) Active. Omeprazole (  Capsule DR, Oral) Active. TraZODone HCl (  Tablet, Oral) Active. Losartan Potassium (  Tablet, Oral) Active. Glimepiride (  Tablet, Oral) Active. Auryxia (1 [id number] MG(Fe) Tablet, Oral) Active. Aspirin (  Tablet, Oral) Active. Medications Reconciled  Social History Christianne Dolin, [place]; 10/31/2016 1:38 PM) Alcohol use Occasional alcohol use. Caffeine use Carbonated beverages. No drug use Tobacco use Former smoker.  Family History Christianne Dolin, [place]; 10/31/2016 1:38 PM) Diabetes Mellitus Father, Mother. Kidney Disease Mother, Sister. Malignant Neoplasm Of Pancreas Mother.  Other Problems Christianne Dolin, RMA; 10/31/2016 1:38 PM) Back Pain Chronic Renal Failure Syndrome Diabetes Mellitus High blood pressure Kidney Stone Other disease, cancer, significant illness     Review of Systems Christianne Dolin RMA; 10/31/2016 1:38 PM) General Present- Fatigue. Not Present- Appetite Loss, Chills, Fever, Night Sweats, Weight Gain and Weight Loss. Skin Not Present- Change in Wart/Mole, Dryness, Hives, Jaundice, New Lesions, Non-Healing Wounds, Rash and Ulcer. HEENT Present- Seasonal Allergies and Wears glasses/contact lenses. Not Present- Earache, Hearing Loss, Hoarseness, Nose Bleed, Oral Ulcers, Ringing in the Ears, Sinus Pain, Sore Throat, Visual Disturbances and Yellow Eyes. Respiratory  Present- Chronic Cough and Snoring. Not Present- Bloody sputum, Difficulty Breathing and Wheezing. Breast Not Present-  Breast Mass, Breast Pain, Nipple Discharge and Skin Changes. Cardiovascular Present- Leg Cramps. Not Present- Chest Pain, Difficulty Breathing Lying Down, Palpitations, Rapid Heart Rate, Shortness of Breath and Swelling of Extremities. Gastrointestinal Present- Excessive gas. Not Present- Abdominal Pain, Bloating, Bloody Stool, Change in Bowel Habits, Chronic diarrhea, Constipation, Difficulty Swallowing, Gets full quickly at meals, Hemorrhoids, Indigestion, Nausea, Rectal Pain and Vomiting. Male Genitourinary Not Present- Blood in Urine, Change in Urinary Stream, Frequency, Impotence, Nocturia, Painful Urination, Urgency and Urine Leakage. Musculoskeletal Not Present- Back Pain, Joint Pain, Joint Stiffness, Muscle Pain, Muscle Weakness and Swelling of Extremities. Neurological Not Present- Decreased Memory, Fainting, Headaches, Numbness, Seizures, Tingling, Tremor, Trouble walking and Weakness. Psychiatric Not Present- Anxiety, Bipolar, Change in Sleep Pattern, Depression, Fearful and Frequent crying. Endocrine Not Present- Cold Intolerance, Excessive Hunger, Hair Changes, Heat Intolerance, Hot flashes and New Diabetes. Hematology Not Present- Blood Thinners, Easy Bruising, Excessive bleeding, Gland problems, HIV and Persistent Infections.  Vitals Christianne Dolin RMA; 10/31/2016 1:41 PM) 10/31/2016 1:41 PM Weight: 278.6 lb Height: 73in Body Surface Area: 2.48 m Body Mass Index: 36.76 kg/m  Temp.: 98.57F  Pulse: 76 (Regular)  BP: 146/94 (Sitting, Left Arm, Standard)      Physical Exam (Chelsea A. Fredricka Bonine MD; 10/31/2016 2:04 PM)  General Note: Alert and well-appearing  Integumentary Note: Skin is warm and dry  Head and Neck Note: No mass or thyromegaly  Eye Note: No scleral icterus, extraocular motions intact  Chest and Lung Exam Note:  Unlabored respirations, clear bilaterally  Cardiovascular Note: Regular rate and rhythm. No pedal edema. Left wrist AV fistula  Abdomen Note: Soft, obese, nondistended. Mildly tender in the right subcostal margin  Neurologic Note: Grossly intact, normal gait  Neuropsychiatric Note: Normal mood and affect, appropriate insight  Musculoskeletal Note: Strength symmetrical throughout, no deformity    Assessment & Plan (Chelsea A. Fredricka Bonine MD; 10/31/2016 2:06 PM)  ESRD (END STAGE RENAL DISEASE) (N18.6)  NONISCHEMIC CARDIOMYOPATHY (I42.8)  HYPERTENSION (I10)  DIABETES (E11.9)  BILIARY COLIC (K80.50) Story: We discussed the pathology of gallstones and possible sequelae including cholecystitis, cholangitis and pancreatitis as well as signs and symptoms of these that should prompt him to go back to the emergency department. I offered him laparoscopic cholecystectomy we discussed the nature of that surgery, the risks of bleeding, infection, pain, scarring, conversion to open surgery, injury to the common bile duct and sequelae, as well as risks of general anesthesia including heart attack, blood clot, stroke, pneumonia and death. He is understanding and desires to proceed. All the secretions were answered. We'll get him scheduled for laparoscopic cholecystectomy, possible cholangiogram. He will need cardiac clearance.

## 2016-11-01 ENCOUNTER — Ambulatory Visit (INDEPENDENT_AMBULATORY_CARE_PROVIDER_SITE_OTHER): Payer: Medicare Other | Admitting: Internal Medicine

## 2016-11-01 ENCOUNTER — Encounter: Payer: Self-pay | Admitting: Internal Medicine

## 2016-11-01 VITALS — BP 130/82 | HR 84 | Ht 73.0 in | Wt 269.8 lb

## 2016-11-01 DIAGNOSIS — N186 End stage renal disease: Secondary | ICD-10-CM

## 2016-11-01 DIAGNOSIS — G4733 Obstructive sleep apnea (adult) (pediatric): Secondary | ICD-10-CM | POA: Diagnosis not present

## 2016-11-01 NOTE — Progress Notes (Signed)
HPI male former smoker followed for OSA complicated by  bronchitis, DM 2, ESRD, GERD, HBP, Unattended Home Sleep Test 08/09/16-[facility]I 20.6/hour, desaturation to 75%, body weight 269 pounds  ------------------------------------------------------------------------------------------------ 07/31/16-46 year old male former smoker referred courtesy of Dr Fayrene Fearing [place] and [facility] Transplant team; never had sleep study. Does not sleep well through night. Denies any gasping for air but has heard himself snore. Medical problems include bronchitis, DM 2, ESRD, GERD, HBP He is being evaluated for kidney transplant and they're screening identified him as at risk for OSA. He says part of the concern is "my heart muscle is weak". He describes fragmented sleep with frequent waking, helped by trazodone. Aware that he snores but has not been told that he stops breathing. Admits daytime sleepiness, part of which is especially evident right after dialysis. No ENT surgery or lung disease. He quit smoking last year. Tends to get epistaxis each winter, especially on dialysis days, suggesting drying. Weight is down about 15 pounds from last year.  11/01/16- 46 year old male former smoker followed for OSA complicated by  bronchitis, DM 2, ESRD, GERD, HBP,  HST done 08/09/16 and pt is here to go over the results and get together a plan.  Unattended Home Sleep Test 08/09/16-AHI 20.6/hour, desaturation to 75%, body weight 269 pounds Here with his wife. Sleep study results reviewed. We discussed potential impact of untreated OSA on his heart and kidney function. He uses trazodone occasionally when needed now for insomnia. Still awaiting kidney transplant.  ROS-see HPI   + = pos Constitutional:    weight loss, night sweats, fevers, chills, + fatigue, lassitude. HEENT:    headaches, difficulty swallowing, tooth/dental problems, sore throat,       sneezing, itching, ear ache, nasal congestion, post nasal drip,  snoring CV:    chest pain, orthopnea, PND, swelling in lower extremities, anasarca,                                                    dizziness, palpitations Resp:   shortness of breath with exertion or at rest.                productive cough,   non-productive cough, coughing up of blood.              change in color of mucus.  wheezing.   Skin:    rash or lesions. GI:  No-   heartburn, indigestion, abdominal pain, nausea, vomiting, diarrhea,                 change in bowel habits, loss of appetite GU: dysuria, change in color of urine, no urgency or frequency.   flank pain. MS:   joint pain, stiffness, decreased range of motion, back pain. Neuro-     nothing unusual Psych:  change in mood or affect.  depression or anxiety.   memory loss.  OBJ- Physical Exam General- Alert, Oriented, Affect-appropriate, Distress- none acute, + overweight Skin- rash-none, lesions- none, excoriation- none Lymphadenopathy- none Head- atraumatic            Eyes- Gross vision intact, PERRLA, conjunctivae and secretions clear            Ears- Hearing, canals-normal            Nose-  +Septal dev, mucosa clear , no-polyps, erosion, perforation  Throat- Mallampati III , mucosa clear , drainage- none, tonsils- atrophic Neck- flexible , trachea midline, no stridor , thyroid nl, carotid no bruit Chest - symmetrical excursion , unlabored           Heart/CV- RRR , no murmur , no gallop  , no rub, nl s1 s2                           - JVD- none , edema- none, stasis changes- none, varices- none           Lung- clear to P&A, wheeze- none, cough- none , dullness-none, rub- none           Chest wall-  Abd-  Br/ Gen/ Rectal- Not done, not indicated Extrem- cyanosis- none, clubbing, none, atrophy- none, strength- nl. + Dialysis access left forearm Neuro- grossly intact to observation

## 2016-11-01 NOTE — Patient Instructions (Addendum)
Order- new DME new CPAP, auto 5-20, mask of choice, humidifier, supplies, AirView   Dx OSA  Please call as needed

## 2016-11-02 NOTE — Assessment & Plan Note (Signed)
He seems unclear about any potential timing for kidney transplant. Apparently getting sleep apnea addressed is one of the steps.

## 2016-11-02 NOTE — Progress Notes (Signed)
lmtcb for pt.  

## 2016-11-13 ENCOUNTER — Telehealth: Payer: Self-pay | Admitting: *Deleted

## 2016-11-13 NOTE — Telephone Encounter (Signed)
   [facility] Pre-operative Risk Assessment    Request for surgical clearance:  1. What type of surgery is being performed? GALLBLADDER SURGERY    2. When is this surgery scheduled? TBD   3. Are there any medications that need to be held prior to surgery and how long?    4. Practice name and name of physician performing surgery? [facility] C CONNER   5. What is your office phone and fax number? PHONE [phone] FAX [phone] [place]    6. Anesthesia type (None, local, MAC, general) ? GENERAL ANESTHESIA

## 2016-11-13 NOTE — Telephone Encounter (Signed)
    Chart reviewed as part of pre-operative protocol coverage. Because of PAYMON BERKOVITZ past medical history and time since last visit, he/she will require a follow-up visit in order to better assess preoperative cardiovascular risk.   Pre-op covering staff: - Please schedule appointment and call patient to inform them. - Please contact requesting surgeon's office via preferred method (i.e, phone, fax) to inform them of need for appointment prior to surgery.  Roe Rutherford Darik Massing, PA  11/13/2016, 1:31 PM

## 2016-11-13 NOTE — Telephone Encounter (Signed)
lmtcb

## 2016-11-14 NOTE — Telephone Encounter (Signed)
Called patient to arrange OV for pre op clearance and per patient request he wished to have Clearance granted by [facility] as he has been seeing them for the last year and they are more well versed on his current care. I told him I would notate it in his chart and contact Dr. Francesca Jewett office at [facility] to have the clearance sent to [facility]. We are both agreeable. I spoke Dewayne Hatch who is the appt coordinator at [facility] who is agreeable to forwarding Clearance request to [facility]

## 2016-12-18 ENCOUNTER — Other Ambulatory Visit (HOSPITAL_COMMUNITY): Payer: Self-pay | Admitting: *Deleted

## 2016-12-18 ENCOUNTER — Other Ambulatory Visit: Payer: Self-pay

## 2016-12-18 ENCOUNTER — Ambulatory Visit (HOSPITAL_COMMUNITY)
Admission: RE | Admit: 2016-12-18 | Discharge: 2016-12-18 | Disposition: A | Discharge status: Home / Self Care | Payer: Medicare Other | Source: Ambulatory Visit | Attending: Surgery | Admitting: Surgery

## 2016-12-18 ENCOUNTER — Encounter (HOSPITAL_COMMUNITY)
Admission: RE | Admit: 2016-12-18 | Discharge: 2016-12-18 | Disposition: A | Discharge status: Home / Self Care | Payer: Medicare Other | Source: Ambulatory Visit | Attending: Surgery | Admitting: Surgery

## 2016-12-18 ENCOUNTER — Encounter (HOSPITAL_COMMUNITY): Payer: Self-pay

## 2016-12-18 DIAGNOSIS — Z992 Dependence on renal dialysis: Secondary | ICD-10-CM | POA: Insufficient documentation

## 2016-12-18 DIAGNOSIS — Z01810 Encounter for preprocedural cardiovascular examination: Secondary | ICD-10-CM

## 2016-12-18 DIAGNOSIS — N186 End stage renal disease: Secondary | ICD-10-CM | POA: Insufficient documentation

## 2016-12-18 DIAGNOSIS — E119 Type 2 diabetes mellitus without complications: Secondary | ICD-10-CM | POA: Insufficient documentation

## 2016-12-18 DIAGNOSIS — Z01812 Encounter for preprocedural laboratory examination: Secondary | ICD-10-CM | POA: Diagnosis present

## 2016-12-18 HISTORY — DX: Iron deficiency: E61.1

## 2016-12-18 HISTORY — DX: Heart failure, unspecified: I50.9

## 2016-12-18 HISTORY — DX: Other cardiomyopathies: I42.8

## 2016-12-18 HISTORY — DX: Diarrhea, unspecified: R19.7

## 2016-12-18 HISTORY — DX: Anxiety disorder, unspecified: F41.9

## 2016-12-18 HISTORY — DX: Sleep apnea, unspecified: G47.30

## 2016-12-18 HISTORY — DX: Personal history of urinary calculi: Z87.442

## 2016-12-18 HISTORY — DX: Restless legs syndrome: G25.81

## 2016-12-18 HISTORY — DX: Pneumonia, unspecified organism: J18.9

## 2016-12-18 HISTORY — DX: Atherosclerotic heart disease of native coronary artery without angina pectoris: I25.10

## 2016-12-18 LAB — APTT: APTT: 40 s — AB (ref 24–36)

## 2016-12-18 LAB — COMPREHENSIVE METABOLIC PANEL
ALBUMIN: 4.1 g/dL (ref 3.5–5.0)
ALK PHOS: 91 U/L (ref 38–126)
ALT: 29 U/L (ref 17–63)
AST: 29 U/L (ref 15–41)
Anion gap: 14 (ref 5–15)
BILIRUBIN TOTAL: 0.5 mg/dL (ref 0.3–1.2)
BUN: 37 mg/dL — AB (ref 6–20)
CALCIUM: 8.2 mg/dL — AB (ref 8.9–10.3)
CO2: 28 mmol/L (ref 22–32)
Chloride: 94 mmol/L — ABNORMAL LOW (ref 101–111)
Creatinine, Ser: 10.39 mg/dL — ABNORMAL HIGH (ref 0.61–1.24)
GFR calc Af Amer: 6 mL/min — ABNORMAL LOW (ref >60)
GFR calc non Af Amer: 5 mL/min — ABNORMAL LOW (ref >60)
GLUCOSE: 97 mg/dL (ref 65–99)
Potassium: 4.7 mmol/L (ref 3.5–5.1)
Sodium: 136 mmol/L (ref 135–145)
TOTAL PROTEIN: 8.2 g/dL — AB (ref 6.5–8.1)

## 2016-12-18 LAB — CBC WITH DIFFERENTIAL/PLATELET
BASOS ABS: 0.1 10*3/uL (ref 0.0–0.1)
BASOS PCT: 1 %
EOS ABS: 0.2 10*3/uL (ref 0.0–0.7)
EOS PCT: 3 %
HEMATOCRIT: 35.7 % — AB (ref 39.0–52.0)
HEMOGLOBIN: 11.9 g/dL — AB (ref 13.0–17.0)
LYMPHS ABS: 2 10*3/uL (ref 0.7–4.0)
LYMPHS PCT: 27 %
MCH: 32 pg (ref 26.0–34.0)
MCHC: 33.3 g/dL (ref 30.0–36.0)
MCV: 96 fL (ref 78.0–100.0)
MONO ABS: 0.7 10*3/uL (ref 0.1–1.0)
Monocytes Relative: 9 %
Neutro Abs: 4.5 10*3/uL (ref 1.7–7.7)
Neutrophils Relative %: 60 %
Platelets: 138 10*3/uL — ABNORMAL LOW (ref 150–400)
RBC: 3.72 MIL/uL — AB (ref 4.22–5.81)
RDW: 14.2 % (ref 11.5–15.5)
WBC: 7.4 10*3/uL (ref 4.0–10.5)

## 2016-12-18 LAB — GLUCOSE, CAPILLARY: Glucose-Capillary: 92 mg/dL (ref 65–99)

## 2016-12-18 LAB — PROTIME-INR
INR: 1.07
Prothrombin Time: 13.8 seconds (ref 11.4–15.2)

## 2016-12-18 LAB — HEMOGLOBIN A1C
Hgb A1c MFr Bld: 6.6 % — ABNORMAL HIGH (ref 4.8–5.6)
Mean Plasma Glucose: 142.72 mg/dL

## 2016-12-18 NOTE — Pre-Procedure Instructions (Signed)
Harold Freeman  12/18/2016    Your procedure is scheduled on Thursday, December 20, 2016 at 12:35 PM.   Report to [facility] Entrance "A" Admitting Office at 10:30 AM.   Call this number if you have problems the morning of surgery: [phone]   Questions prior to day of surgery, please call [phone] between 8 & 4 PM.   Remember:  Do not eat food or drink liquids after midnight Wednesday, 12/19/16.  Take these medicines the morning of surgery with A SIP OF WATER: Carvedilol (Coreg), Omeprazole (Prilosec), Hydrocodone - if needed.  Do not take Amaryl (Glimepiride) morning of surgery.  Stop Multivitamins and NSAIDS (Ibuprofen, Aleve,etc) as of today. Stop Aspirin as instructed by physician/surgeon.   How to Manage Your Diabetes Before Surgery   Why is it important to control my blood sugar before and after surgery?   Improving blood sugar levels before and after surgery helps healing and can limit problems.  A way of improving blood sugar control is eating a healthy diet by:  - Eating less sugar and carbohydrates  - Increasing activity/exercise  - Talk with your doctor about reaching your blood sugar goals  High blood sugars (greater than 180 mg/dL) can raise your risk of infections and slow down your recovery so you will need to focus on controlling your diabetes during the weeks before surgery.  Make sure that the doctor who takes care of your diabetes knows about your planned surgery including the date and location.  How do I manage my blood sugars before surgery?   Check your blood sugar at least 4 times a day, 2 days before surgery to make sure that they are not too high or low.  Check your blood sugar the morning of your surgery when you wake up and every 2 hours until you get to the Short-Stay unit.  Treat a low blood sugar (less than 70 mg/dL) with 1/2 cup of clear juice (cranberry or apple), 4 glucose tablets, OR glucose gel.  Recheck blood sugar  in 15 minutes after treatment (to make sure it is greater than 70 mg/dL).  If blood sugar is not greater than 70 mg/dL on re-check, call [phone] for further instructions.   Report your blood sugar to the Short-Stay nurse when you get to Short-Stay.  References:  [facility], 2007 "How to Manage your Diabetes Before and After Surgery".   Do not wear jewelry.  Do not wear lotions, powders, cologne or deodorant.  Men may shave face and neck.  Do not bring valuables to the hospital.  [facility] is not responsible for any belongings or valuables.  Contacts, dentures or bridgework may not be worn into surgery.  Leave your suitcase in the car.  After surgery it may be brought to your room.  For patients admitted to the hospital, discharge time will be determined by your treatment team.  Patients discharged the day of surgery will not be allowed to drive home.   Harold [facility] - Preparing for Surgery  Before surgery, you can play an important role.  Because skin is not sterile, your skin needs to be as free of germs as possible.  You can reduce the number of germs on you skin by washing with CHG (chlorahexidine gluconate) soap before surgery.  CHG is an antiseptic cleaner which kills germs and bonds with the skin to continue killing germs even after washing.  Please DO NOT use if you have an allergy to  CHG or antibacterial soaps.  If your skin becomes reddened/irritated stop using the CHG and inform your nurse when you arrive at Short Stay.  Do not shave (including legs and underarms) for at least 48 hours prior to the first CHG shower.  You may shave your face.  Please follow these instructions carefully:   1.  Shower with CHG Soap the night before surgery and the                    morning of Surgery.  2.  If you choose to wash your hair, wash your hair first as usual with your       normal shampoo.  3.  After you shampoo, rinse your hair and body thoroughly to  remove the shampoo.  4.  Use CHG as you would any other liquid soap.  You can apply chg directly       to the skin and wash gently with scrungie or a clean washcloth.  5.  Apply the CHG Soap to your body ONLY FROM THE NECK DOWN.        Do not use on open wounds or open sores.  Avoid contact with your eyes, ears, mouth and genitals (private parts).  Wash genitals (private parts) with your normal soap.  6.  Wash thoroughly, paying special attention to the area where your surgery        will be performed.  7.  Thoroughly rinse your body with warm water from the neck down.  8.  DO NOT shower/wash with your normal soap after using and rinsing off       the CHG Soap.  9.  Pat yourself dry with a clean towel.            10.  Wear clean pajamas.            11.  Place clean sheets on your bed the night of your first shower and do not        sleep with pets.  Day of Surgery  Do not apply any lotions/deodorants the morning of surgery.  Please wear clean clothes to the hospital.   Please read over the fact sheets that you were given.

## 2016-12-18 NOTE — Progress Notes (Signed)
Pt has hx of ESRD and is on dialysis. Pt is also on the kidney transplant list. Pt sees a cardiologist at [facility] for idiopathic nonischemic cardiomyopathy and CHF. Pt is a type 2 diabetic. He states he's not sure when or what his A1C was. States his fasting blood sugar is usually between 120-130. Pt has sleep apnea, but is unable to tolerate CPAP mask.

## 2016-12-19 ENCOUNTER — Encounter (HOSPITAL_COMMUNITY): Payer: Self-pay

## 2016-12-19 MED ORDER — DEXTROSE 5 % IV SOLN
3.0000 g | INTRAVENOUS | Status: AC
  Administered 2016-12-20: 3 g via INTRAVENOUS
  Filled 2016-12-19: qty 3000

## 2016-12-19 MED ORDER — ACETAMINOPHEN 500 MG PO TABS
1000.0000 mg | ORAL_TABLET | ORAL | Status: AC
  Administered 2016-12-20: 1000 mg via ORAL
  Filled 2016-12-19: qty 2

## 2016-12-19 NOTE — Progress Notes (Signed)
Anesthesia Chart Review:  Pt is a 46 year old male scheduled for laparoscopic cholecystectomy with possible intraoperative cholangiogram on 12/20/2016 with Phylliss Blakes, M.D.  - Nephrologist is Beryle Lathe, MD  - Pulmonologist is Jetty Duhamel, MD who sees pt for OSA  - Cardiologist is Odis Hollingshead, MD at [facility] who cleared pt for surgery noting "care should be taken to minimize fluid given during procedure".  (notes in care everywhere)   PMH includes: CAD (LAD 40%, RCA 90% 07/04/12), CHF, nonischemic cardiomyopathy, HTN, DM, hyperlipidemia, ESRD on hemodialysis (being evaluated at [facility] for transplant), OSA, GERD. Former smoker (quit 06/23/15).   Medications include: ASA 81 mg, carvedilol, glimepiride, Prilosec  BP (!) 162/93   Pulse 76   Temp 36.9 C (Oral)   Resp 16   SpO2 97%    Preoperative labs reviewed.   - HbA1c 6.6, glucose 97 - Renal function consistent with ESRD - PT normal, PTT 40  CXR 12/18/16: No active cardiopulmonary disease.  EKG 12/18/16:  - NSR. LAD. LVH. T wave abnormality, consider anterolateral ischemia - Appears stable when compared to prior EKG 06/06/16 (done at [facility])  Nuclear stress test 11/06/16 (care everywhere):  - No inducible ischemia. - Global left ventricular hypokinesis with EF of 36%.  Echo 09/06/16 (care everywhere):  1. LV mildly dilated. Mild LVH. LV systolic function moderately reduced. EF 35-40%. LV filling pattern is prolonged relaxation. Moderate global hypokinesis of LV. 2. RV normal in size and function. 3. No significant valvular stenosis or regurgitation. 4. Aortic root is normal size. 5. No pericardial effusion. 6. Probably no significant change in comparison with prior study noted  Cardiac cath 07/04/12:  1. 2 vessel CAD with nonobstructive 40% LAD stenosis and 90% lesion in midsection of nondominant RCA 2. Non-ischemic cardiomyopathy with EF 30-35% - Medical management recommended.    Pt with abnormal EKG, but it appears  stable compared to prior EKG from May, and pt had no ischemia on nuclear stress test in October.   If no changes, I anticipate pt can proceed with surgery as scheduled.   Rica Mast, FNP-BC [facility]/Anesthesiology Phone: [phone] 12/19/2016 12:33 PM

## 2016-12-20 ENCOUNTER — Encounter (HOSPITAL_COMMUNITY)
Admission: RE | Disposition: A | Discharge status: Home / Self Care | Payer: Self-pay | Source: Ambulatory Visit | Attending: Surgery

## 2016-12-20 ENCOUNTER — Ambulatory Visit (HOSPITAL_COMMUNITY): Payer: Medicare Other | Admitting: Emergency Medicine

## 2016-12-20 ENCOUNTER — Encounter (HOSPITAL_COMMUNITY): Payer: Self-pay | Admitting: *Deleted

## 2016-12-20 ENCOUNTER — Ambulatory Visit (HOSPITAL_COMMUNITY)
Admission: RE | Admit: 2016-12-20 | Discharge: 2016-12-20 | Disposition: A | Discharge status: Home / Self Care | Payer: Medicare Other | Source: Ambulatory Visit | Attending: Surgery | Admitting: Surgery

## 2016-12-20 ENCOUNTER — Ambulatory Visit (HOSPITAL_COMMUNITY): Payer: Medicare Other | Admitting: Anesthesiology

## 2016-12-20 DIAGNOSIS — Z7984 Long term (current) use of oral hypoglycemic drugs: Secondary | ICD-10-CM | POA: Diagnosis not present

## 2016-12-20 DIAGNOSIS — Z79899 Other long term (current) drug therapy: Secondary | ICD-10-CM | POA: Insufficient documentation

## 2016-12-20 DIAGNOSIS — I509 Heart failure, unspecified: Secondary | ICD-10-CM | POA: Diagnosis not present

## 2016-12-20 DIAGNOSIS — Z7982 Long term (current) use of aspirin: Secondary | ICD-10-CM | POA: Diagnosis not present

## 2016-12-20 DIAGNOSIS — N186 End stage renal disease: Secondary | ICD-10-CM | POA: Diagnosis not present

## 2016-12-20 DIAGNOSIS — K219 Gastro-esophageal reflux disease without esophagitis: Secondary | ICD-10-CM | POA: Diagnosis not present

## 2016-12-20 DIAGNOSIS — Z91018 Allergy to other foods: Secondary | ICD-10-CM | POA: Diagnosis not present

## 2016-12-20 DIAGNOSIS — I132 Hypertensive heart and chronic kidney disease with heart failure and with stage 5 chronic kidney disease, or end stage renal disease: Secondary | ICD-10-CM | POA: Insufficient documentation

## 2016-12-20 DIAGNOSIS — Z87891 Personal history of nicotine dependence: Secondary | ICD-10-CM | POA: Diagnosis not present

## 2016-12-20 DIAGNOSIS — Z6836 Body mass index (BMI) 36.0-36.9, adult: Secondary | ICD-10-CM | POA: Diagnosis not present

## 2016-12-20 DIAGNOSIS — Q613 Polycystic kidney, unspecified: Secondary | ICD-10-CM | POA: Diagnosis not present

## 2016-12-20 DIAGNOSIS — K8064 Calculus of gallbladder and bile duct with chronic cholecystitis without obstruction: Secondary | ICD-10-CM | POA: Diagnosis not present

## 2016-12-20 DIAGNOSIS — G4730 Sleep apnea, unspecified: Secondary | ICD-10-CM | POA: Diagnosis not present

## 2016-12-20 DIAGNOSIS — Z992 Dependence on renal dialysis: Secondary | ICD-10-CM | POA: Insufficient documentation

## 2016-12-20 DIAGNOSIS — E1122 Type 2 diabetes mellitus with diabetic chronic kidney disease: Secondary | ICD-10-CM | POA: Insufficient documentation

## 2016-12-20 DIAGNOSIS — I429 Cardiomyopathy, unspecified: Secondary | ICD-10-CM | POA: Diagnosis not present

## 2016-12-20 DIAGNOSIS — Z87442 Personal history of urinary calculi: Secondary | ICD-10-CM | POA: Diagnosis not present

## 2016-12-20 DIAGNOSIS — K8050 Calculus of bile duct without cholangitis or cholecystitis without obstruction: Secondary | ICD-10-CM | POA: Diagnosis present

## 2016-12-20 HISTORY — PX: CHOLECYSTECTOMY: SHX55

## 2016-12-20 LAB — GLUCOSE, CAPILLARY
GLUCOSE-CAPILLARY: 147 mg/dL — AB (ref 65–99)
Glucose-Capillary: 149 mg/dL — ABNORMAL HIGH (ref 65–99)
Glucose-Capillary: 153 mg/dL — ABNORMAL HIGH (ref 65–99)

## 2016-12-20 LAB — POCT I-STAT 4, (NA,K, GLUC, HGB,HCT)
Glucose, Bld: 151 mg/dL — ABNORMAL HIGH (ref 65–99)
HEMATOCRIT: 36 % — AB (ref 39.0–52.0)
Hemoglobin: 12.2 g/dL — ABNORMAL LOW (ref 13.0–17.0)
Potassium: 5.5 mmol/L — ABNORMAL HIGH (ref 3.5–5.1)
Sodium: 137 mmol/L (ref 135–145)

## 2016-12-20 SURGERY — LAPAROSCOPIC CHOLECYSTECTOMY WITH INTRAOPERATIVE CHOLANGIOGRAM
Anesthesia: General | Site: Abdomen

## 2016-12-20 MED ORDER — OXYCODONE HCL 5 MG PO TABS
ORAL_TABLET | ORAL | Status: AC
  Filled 2016-12-20: qty 1

## 2016-12-20 MED ORDER — SODIUM CHLORIDE 0.9% FLUSH: 3.0000 mL | Freq: Two times a day (BID) | INTRAVENOUS | Status: DC

## 2016-12-20 MED ORDER — SODIUM CHLORIDE 0.9 % IR SOLN
Status: DC | PRN
  Administered 2016-12-20: 1000 mL

## 2016-12-20 MED ORDER — ROCURONIUM BROMIDE 10 MG/ML (PF) SYRINGE
PREFILLED_SYRINGE | INTRAVENOUS | Status: AC
  Filled 2016-12-20: qty 5

## 2016-12-20 MED ORDER — MIDAZOLAM HCL 2 MG/2ML IJ SOLN
INTRAMUSCULAR | Status: AC
  Filled 2016-12-20: qty 2

## 2016-12-20 MED ORDER — ACETAMINOPHEN 650 MG RE SUPP: 650.0000 mg | RECTAL | Status: DC | PRN

## 2016-12-20 MED ORDER — DOCUSATE SODIUM 100 MG PO CAPS: 100.0000 mg | ORAL_CAPSULE | Freq: Two times a day (BID) | ORAL | 0 refills | Status: AC

## 2016-12-20 MED ORDER — HYDROMORPHONE HCL 1 MG/ML IJ SOLN: 0.2500 mg | INTRAMUSCULAR | Status: DC | PRN

## 2016-12-20 MED ORDER — OXYCODONE-ACETAMINOPHEN 5-325 MG PO TABS: 1.0000 | ORAL_TABLET | Freq: Four times a day (QID) | ORAL | 0 refills | Status: DC | PRN

## 2016-12-20 MED ORDER — OXYCODONE HCL 5 MG PO TABS
5.0000 mg | ORAL_TABLET | Freq: Once | ORAL | Status: AC | PRN
  Administered 2016-12-20: 5 mg via ORAL

## 2016-12-20 MED ORDER — SODIUM CHLORIDE 0.9 % IV SOLN
INTRAVENOUS | Status: DC
  Administered 2016-12-20: 11:00:00 via INTRAVENOUS

## 2016-12-20 MED ORDER — OXYCODONE HCL 5 MG/5ML PO SOLN: 5.0000 mg | Freq: Once | ORAL | Status: AC | PRN

## 2016-12-20 MED ORDER — OXYCODONE HCL 5 MG PO TABS: 5.0000 mg | ORAL_TABLET | ORAL | Status: DC | PRN

## 2016-12-20 MED ORDER — PROPOFOL 10 MG/ML IV BOLUS
INTRAVENOUS | Status: DC | PRN
  Administered 2016-12-20 (×3): 20 mg via INTRAVENOUS
  Administered 2016-12-20: 100 mg via INTRAVENOUS

## 2016-12-20 MED ORDER — BUPIVACAINE-EPINEPHRINE (PF) 0.25% -1:200000 IJ SOLN
INTRAMUSCULAR | Status: AC
  Filled 2016-12-20: qty 30

## 2016-12-20 MED ORDER — PROMETHAZINE HCL 25 MG/ML IJ SOLN
INTRAMUSCULAR | Status: AC
  Filled 2016-12-20: qty 1

## 2016-12-20 MED ORDER — ONDANSETRON HCL 4 MG/2ML IJ SOLN
INTRAMUSCULAR | Status: AC
  Filled 2016-12-20: qty 2

## 2016-12-20 MED ORDER — DEXAMETHASONE SODIUM PHOSPHATE 10 MG/ML IJ SOLN
INTRAMUSCULAR | Status: DC | PRN
  Administered 2016-12-20: 10 mg via INTRAVENOUS

## 2016-12-20 MED ORDER — FENTANYL CITRATE (PF) 100 MCG/2ML IJ SOLN: 25.0000 ug | INTRAMUSCULAR | Status: DC | PRN

## 2016-12-20 MED ORDER — 0.9 % SODIUM CHLORIDE (POUR BTL) OPTIME
TOPICAL | Status: DC | PRN
  Administered 2016-12-20: 1000 mL

## 2016-12-20 MED ORDER — LIDOCAINE HCL (CARDIAC) 20 MG/ML IV SOLN
INTRAVENOUS | Status: DC | PRN
  Administered 2016-12-20: 60 mg via INTRAVENOUS

## 2016-12-20 MED ORDER — CHLORHEXIDINE GLUCONATE 4 % EX LIQD: 60.0000 mL | Freq: Once | CUTANEOUS | Status: DC

## 2016-12-20 MED ORDER — PROPOFOL 10 MG/ML IV BOLUS
INTRAVENOUS | Status: AC
  Filled 2016-12-20: qty 20

## 2016-12-20 MED ORDER — ROCURONIUM BROMIDE 100 MG/10ML IV SOLN
INTRAVENOUS | Status: DC | PRN
  Administered 2016-12-20: 10 mg via INTRAVENOUS
  Administered 2016-12-20: 40 mg via INTRAVENOUS

## 2016-12-20 MED ORDER — IOPAMIDOL (ISOVUE-300) INJECTION 61%
INTRAVENOUS | Status: AC
  Filled 2016-12-20: qty 50

## 2016-12-20 MED ORDER — BUPIVACAINE-EPINEPHRINE 0.25% -1:200000 IJ SOLN
INTRAMUSCULAR | Status: DC | PRN
  Administered 2016-12-20: 10 mL

## 2016-12-20 MED ORDER — SODIUM CHLORIDE 0.9% FLUSH: 3.0000 mL | INTRAVENOUS | Status: DC | PRN

## 2016-12-20 MED ORDER — PROMETHAZINE HCL 25 MG/ML IJ SOLN
6.2500 mg | INTRAMUSCULAR | Status: DC | PRN
  Administered 2016-12-20: 6.25 mg via INTRAVENOUS

## 2016-12-20 MED ORDER — FENTANYL CITRATE (PF) 100 MCG/2ML IJ SOLN
INTRAMUSCULAR | Status: DC | PRN
  Administered 2016-12-20 (×2): 50 ug via INTRAVENOUS
  Administered 2016-12-20: 100 ug via INTRAVENOUS

## 2016-12-20 MED ORDER — LIDOCAINE 2% (20 MG/ML) 5 ML SYRINGE
INTRAMUSCULAR | Status: AC
  Filled 2016-12-20: qty 5

## 2016-12-20 MED ORDER — ONDANSETRON HCL 4 MG/2ML IJ SOLN
INTRAMUSCULAR | Status: DC | PRN
  Administered 2016-12-20: 4 mg via INTRAVENOUS

## 2016-12-20 MED ORDER — FENTANYL CITRATE (PF) 250 MCG/5ML IJ SOLN
INTRAMUSCULAR | Status: AC
  Filled 2016-12-20: qty 5

## 2016-12-20 MED ORDER — SUGAMMADEX SODIUM 500 MG/5ML IV SOLN
INTRAVENOUS | Status: DC | PRN
  Administered 2016-12-20: 300 mg via INTRAVENOUS

## 2016-12-20 MED ORDER — MIDAZOLAM HCL 5 MG/5ML IJ SOLN
INTRAMUSCULAR | Status: DC | PRN
  Administered 2016-12-20 (×2): 1 mg via INTRAVENOUS

## 2016-12-20 MED ORDER — ACETAMINOPHEN 325 MG PO TABS: 650.0000 mg | ORAL_TABLET | ORAL | Status: DC | PRN

## 2016-12-20 MED ORDER — DEXAMETHASONE SODIUM PHOSPHATE 10 MG/ML IJ SOLN
INTRAMUSCULAR | Status: AC
  Filled 2016-12-20: qty 1

## 2016-12-20 MED ORDER — SODIUM CHLORIDE 0.9 % IV SOLN: 250.0000 mL | INTRAVENOUS | Status: DC | PRN

## 2016-12-20 SURGICAL SUPPLY — 39 items
ADH SKN CLS APL DERMABOND .7 (GAUZE/BANDAGES/DRESSINGS) ×1
APPLIER CLIP 5 13 M/L LIGAMAX5 (MISCELLANEOUS) ×2
APR CLP MED LRG 5 ANG JAW (MISCELLANEOUS) ×1
BAG SPEC RTRVL LRG 6X4 10 (ENDOMECHANICALS) ×1
CANISTER SUCT 3000ML PPV (MISCELLANEOUS) ×2 IMPLANT
CHLORAPREP W/TINT 26ML (MISCELLANEOUS) ×2 IMPLANT
CLIP APPLIE 5 13 M/L LIGAMAX5 (MISCELLANEOUS) ×1 IMPLANT
COVER MAYO STAND STRL (DRAPES) ×1 IMPLANT
COVER SURGICAL LIGHT HANDLE (MISCELLANEOUS) ×2 IMPLANT
DERMABOND ADVANCED (GAUZE/BANDAGES/DRESSINGS) ×1
DERMABOND ADVANCED .7 DNX12 (GAUZE/BANDAGES/DRESSINGS) ×1 IMPLANT
DRAPE C-ARM 42X72 X-RAY (DRAPES) ×1 IMPLANT
ELECT REM PT RETURN 9FT ADLT (ELECTROSURGICAL) ×2
ELECTRODE REM PT RTRN 9FT ADLT (ELECTROSURGICAL) ×1 IMPLANT
GLOVE BIO SURGEON STRL SZ 6 (GLOVE) ×2 IMPLANT
GLOVE BIOGEL PI IND STRL 6.5 (GLOVE) ×1 IMPLANT
GLOVE BIOGEL PI INDICATOR 6.5 (GLOVE) ×1
GOWN STRL REUS W/ TWL LRG LVL3 (GOWN DISPOSABLE) ×3 IMPLANT
GOWN STRL REUS W/TWL LRG LVL3 (GOWN DISPOSABLE) ×6
GRASPER SUT TROCAR 14GX15 (MISCELLANEOUS) ×2 IMPLANT
KIT BASIN OR (CUSTOM PROCEDURE TRAY) ×2 IMPLANT
KIT ROOM TURNOVER OR (KITS) ×2 IMPLANT
NDL INSUFFLATION 14GA 120MM (NEEDLE) ×1 IMPLANT
NEEDLE INSUFFLATION 14GA 120MM (NEEDLE) ×2 IMPLANT
NS IRRIG 1000ML POUR BTL (IV SOLUTION) ×2 IMPLANT
PAD ARMBOARD 7.5X6 YLW CONV (MISCELLANEOUS) ×2 IMPLANT
POUCH SPECIMEN RETRIEVAL 10MM (ENDOMECHANICALS) ×2 IMPLANT
SCISSORS LAP 5X35 DISP (ENDOMECHANICALS) ×2 IMPLANT
SET CHOLANGIOGRAPH 5 50 .035 (SET/KITS/TRAYS/PACK) ×1 IMPLANT
SET IRRIG TUBING LAPAROSCOPIC (IRRIGATION / IRRIGATOR) ×2 IMPLANT
SLEEVE ENDOPATH XCEL 5M (ENDOMECHANICALS) ×2 IMPLANT
SPECIMEN JAR SMALL (MISCELLANEOUS) ×2 IMPLANT
SUT MNCRL AB 4-0 PS2 18 (SUTURE) ×2 IMPLANT
TOWEL OR 17X24 6PK STRL BLUE (TOWEL DISPOSABLE) ×2 IMPLANT
TOWEL OR 17X26 10 PK STRL BLUE (TOWEL DISPOSABLE) ×2 IMPLANT
TRAY LAPAROSCOPIC MC (CUSTOM PROCEDURE TRAY) ×2 IMPLANT
TROCAR XCEL NON-BLD 11X100MML (ENDOMECHANICALS) ×2 IMPLANT
TROCAR XCEL NON-BLD 5MMX100MML (ENDOMECHANICALS) ×2 IMPLANT
TUBING INSUFFLATION (TUBING) ×2 IMPLANT

## 2016-12-20 NOTE — Anesthesia Preprocedure Evaluation (Signed)
Anesthesia Evaluation  Patient identified by MRN, date of birth, ID band Patient awake    Reviewed: Allergy & Precautions, H&P , NPO status , Patient's Chart, lab work & pertinent test results, reviewed documented beta blocker date and time   Airway Mallampati: II  TM Distance: >3 FB Neck ROM: full    Dental no notable dental hx. (+) Teeth Intact, Dental Advisory Given   Pulmonary sleep apnea , former smoker,    Pulmonary exam normal breath sounds clear to auscultation       Cardiovascular hypertension, Pt. on medications + CAD and +CHF  Normal cardiovascular exam Rhythm:Regular Rate:Normal     Neuro/Psych    GI/Hepatic GERD  Medicated,  Endo/Other  diabetes, Type 2, Oral Hypoglycemic AgentsMorbid obesity  Renal/GU ESRF and DialysisRenal disease     Musculoskeletal   Abdominal (+) + obese,   Peds  Hematology   Anesthesia Other Findings   Reproductive/Obstetrics                             Anesthesia Physical  Anesthesia Plan  ASA: IV  Anesthesia Plan: General   Post-op Pain Management:    Induction: Intravenous  PONV Risk Score and Plan: 2 and Ondansetron and Midazolam  Airway Management Planned: Oral ETT  Additional Equipment:   Intra-op Plan:   Post-operative Plan:   Informed Consent: I have reviewed the patients History and Physical, chart, labs and discussed the procedure including the risks, benefits and alternatives for the proposed anesthesia with the patient or authorized representative who has indicated his/her understanding and acceptance.   Dental advisory given  Plan Discussed with: CRNA and Surgeon  Anesthesia Plan Comments:         Anesthesia Quick Evaluation

## 2016-12-20 NOTE — H&P (Signed)
Harold Freeman  [facility] Patient #: [id number] DOB: Jan 14, 1971 Married / Language: English / Race: White Male  History of Present Illness Patient words: This is a very nice 46 year old gentleman is referred for biliary colic. Last week he presented to the emergency department with an acute episode of right upper quadrant pain which was sharp and constant in quality. It woke him from sleep and lasted for 8-12 hours. Associated with nausea initially. No fevers. Did radiate to the side somewhat but no radiation to the epigastrium chest or back. He underwent an ultrasound of the right upper quadrant which demonstrated gallstones and some gallbladder wall thickening, common bile duct was 8 mm. His white count was 6, LFTs were normal. Lipase was 68. His pain improved for a left emergency department and so he is referred for outpatient follow-up. He has not had any subsequent episodes. He's had prior episodes of kidney stones but the pain from this is in his back and this was different. This was the first time he experienced this type of pain.  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 Former smoker. History of end-stage renal disease secondary to polycystic kidney disease and he is right on the transplant list. He is also diabetic. He has nonischemic cardiomyopathy with his most recent echo showing an EF of 40%. He states that he had a catheterization in 2013 did not show any major blockages. He is actually scheduled to have a nuclear stress test on the 16th of this month. No prior abdominal surgeries.  The patient is a 46 year old male.   Past Surgical History Dialysis Shunt / Fistula Foot Surgery Bilateral. Knee Surgery Right.  Diagnostic Studies History  Colonoscopy never  Allergies  No Known Allergies 10/31/2016  Medication History  Allopurinol (300MG  Tablet, Oral) Active. Rena-Vite (Oral)  Active. BuPROPion HCl ER (SR) (150MG  Tablet ER 12HR, Oral) Active. Carvedilol (25MG  Tablet, Oral) Active. Hydrocodone-Acetaminophen (5-325MG  Tablet, Oral) Active. Calcium Acetate (Phos Binder) (667MG  Capsule, Oral) Active. ROPINIRole HCl (0.25MG  Tablet, Oral) Active. Simvastatin (20MG  Tablet, Oral) Active. Omeprazole (20MG  Capsule DR, Oral) Active. TraZODone HCl (50MG  Tablet, Oral) Active. Losartan Potassium (50MG  Tablet, Oral) Active. Glimepiride (4MG  Tablet, Oral) Active. Auryxia (1 [id number] MG(Fe) Tablet, Oral) Active. Aspirin (81MG  Tablet, Oral) Active. Medications Reconciled  Social History  Alcohol use Occasional alcohol use. Caffeine use Carbonated beverages. No drug use Tobacco use Former smoker.  Family History  Diabetes Mellitus Father, Mother. Kidney Disease Mother, Sister. Malignant Neoplasm Of Pancreas Mother.  Other Problems Back Pain Chronic Renal Failure Syndrome Diabetes Mellitus High blood pressure Kidney Stone Other disease, cancer, significant illness    Review of Systems  General Present- Fatigue. Not Present- Appetite Loss, Chills, Fever, Night Sweats, Weight Gain and Weight Loss. Skin Not Present- Change in Wart/Mole, Dryness, Hives, Jaundice, New Lesions, Non-Healing Wounds, Rash and Ulcer. HEENT Present- Seasonal Allergies and Wears glasses/contact lenses. Not Present- Earache, Hearing Loss, Hoarseness, Nose Bleed, Oral Ulcers, Ringing in the Ears, Sinus Pain, Sore Throat, Visual Disturbances and Yellow Eyes. Respiratory Present- Chronic Cough and Snoring. Not Present- Bloody sputum, Difficulty Breathing and Wheezing. Breast Not Present- Breast Mass, Breast Pain, Nipple Discharge and Skin Changes. Cardiovascular Present- Leg Cramps. Not Present- Chest Pain, Difficulty Breathing Lying Down, Palpitations, Rapid Heart Rate, Shortness of Breath and Swelling of Extremities. Gastrointestinal Present- Excessive gas. Not  Present- Abdominal Pain, Bloating, Bloody Stool, Change in Bowel Habits, Chronic diarrhea, Constipation, Difficulty Swallowing, Gets full  quickly at meals, Hemorrhoids, Indigestion, Nausea, Rectal Pain and Vomiting. Male Genitourinary Not Present- Blood in Urine, Change in Urinary Stream, Frequency, Impotence, Nocturia, Painful Urination, Urgency and Urine Leakage. Musculoskeletal Not Present- Back Pain, Joint Pain, Joint Stiffness, Muscle Pain, Muscle Weakness and Swelling of Extremities. Neurological Not Present- Decreased Memory, Fainting, Headaches, Numbness, Seizures, Tingling, Tremor, Trouble walking and Weakness. Psychiatric Not Present- Anxiety, Bipolar, Change in Sleep Pattern, Depression, Fearful and Frequent crying. Endocrine Not Present- Cold Intolerance, Excessive Hunger, Hair Changes, Heat Intolerance, Hot flashes and New Diabetes. Hematology Not Present- Blood Thinners, Easy Bruising, Excessive bleeding, Gland problems, HIV and Persistent Infections.  Vitals  Weight: 278.6 lb Height: 73in Body Surface Area: 2.48 m Body Mass Index: 36.76 kg/m  Temp.: 98.28F  Pulse: 76 (Regular)  BP: 146/94 (Sitting, Left Arm, Standard)    Physical Exam   General Note: Alert and well-appearing  Integumentary Note: Skin is warm and dry  Head and Neck Note: No mass or thyromegaly  Eye Note: No scleral icterus, extraocular motions intact  Chest and Lung Exam Note: Unlabored respirations, clear bilaterally  Cardiovascular Note: Regular rate and rhythm. No pedal edema. Left wrist AV fistula  Abdomen Note: Soft, obese, nondistended. Mildly tender in the right subcostal margin  Neurologic Note: Grossly intact, normal gait  Neuropsychiatric Note: Normal mood and affect, appropriate insight  Musculoskeletal Note: Strength symmetrical throughout, no deformity    Assessment & Plan  ESRD (END STAGE RENAL DISEASE) (N18.6)  NONISCHEMIC  CARDIOMYOPATHY (I42.8)  HYPERTENSION (I10)  DIABETES (E11.9)  BILIARY COLIC (K80.50) Story: We discussed the pathology of gallstones and possible sequelae including cholecystitis, cholangitis and pancreatitis as well as signs and symptoms of these that should prompt him to go back to the emergency department. I offered him laparoscopic cholecystectomy we discussed the nature of that surgery, the risks of bleeding, infection, pain, scarring, conversion to open surgery, injury to the common bile duct and sequelae, as well as risks of general anesthesia including heart attack, blood clot, stroke, pneumonia and death. He is understanding and desires to proceed. All questions were answered. We'll get him scheduled for laparoscopic cholecystectomy, possible cholangiogram. He will need cardiac clearance.

## 2016-12-20 NOTE — Anesthesia Procedure Notes (Signed)
Procedure Name: Intubation Date/Time: 12/20/2016 1:34 PM Performed by: Oleta Mouse, MD Pre-anesthesia Checklist: Patient identified, Emergency Drugs available, Suction available, Patient being monitored and Timeout performed Patient Re-evaluated:Patient Re-evaluated prior to induction Oxygen Delivery Method: Circle system utilized Preoxygenation: Pre-oxygenation with 100% oxygen Induction Type: IV induction Ventilation: Mask ventilation without difficulty Laryngoscope Size: Mac and 4 Grade View: Grade II Tube type: Oral Tube size: 7.5 mm Number of attempts: 1 Airway Equipment and Method: Stylet Placement Confirmation: ETT inserted through vocal cords under direct vision,  positive ETCO2 and breath sounds checked- equal and bilateral Secured at: 21 cm Tube secured with: Tape Dental Injury: Teeth and Oropharynx as per pre-operative assessment

## 2016-12-20 NOTE — Interval H&P Note (Signed)
History and Physical Interval Note:  12/20/2016 12:34 PM  Harold Freeman  has presented today for surgery, with the diagnosis of biliary colic  The various methods of treatment have been discussed with the patient and family. After consideration of risks, benefits and other options for treatment, the patient has consented to  Procedure(s): LAPAROSCOPIC CHOLECYSTECTOMY WITH POSSIBLE INTRAOPERATIVE CHOLANGIOGRAM (N/A) as a surgical intervention .  The patient's history has been reviewed, patient examined, no change in status, stable for surgery.  I have reviewed the patient's chart and labs.  Questions were answered to the patient's satisfaction.     Chelsea Lollie Sails

## 2016-12-20 NOTE — Discharge Instructions (Signed)
LAPAROSCOPIC SURGERY: POST OP INSTRUCTIONS  ######################################################################  EAT Gradually transition to a high fiber diet with a fiber supplement over the next few weeks after discharge.  Start with a pureed / full liquid diet (see below)  WALK Walk an hour a day.  Control your pain to do that.    CONTROL PAIN Control pain so that you can walk, sleep, tolerate sneezing/coughing, go up/down stairs.  HAVE A BOWEL MOVEMENT DAILY Keep your bowels regular to avoid problems.  OK to try a laxative to override constipation.  OK to use an antidairrheal to slow down diarrhea.  Call if not better after 2 tries  CALL IF YOU HAVE PROBLEMS/CONCERNS Call if you are still struggling despite following these instructions. Call if you have concerns not answered by these instructions  ######################################################################    1. DIET: Follow a light bland diet the first 24 hours after arrival home, such as soup, liquids, crackers, etc.  Be sure to include lots of fluids daily.  Avoid fast food or heavy meals as your are more likely to get nauseated.  Eat a low fat the next few days after surgery.   2. Take your usually prescribed home medications unless otherwise directed. 3. PAIN CONTROL: a. Pain is best controlled by a usual combination of three different methods TOGETHER: i. Ice/Heat ii. Over the counter pain medication iii. Prescription pain medication b. Most patients will experience some swelling and bruising around the incisions.  Ice packs or heating pads (30-60 minutes up to 6 times a day) will help. Use ice for the first few days to help decrease swelling and bruising, then switch to heat to help relax tight/sore spots and speed recovery.  Some people prefer to use ice alone, heat alone, alternating between ice & heat.  Experiment to what works for you.  Swelling and bruising can take several weeks to resolve.   c. It is  helpful to take an over-the-counter pain medication regularly for the first few weeks.  Choose one of the following that works best for you: i. Naproxen (Aleve, etc)  Two 251m tabs twice a day ii. Ibuprofen (Advil, etc) Three 2053mtabs four times a day (every meal & bedtime) iii. Acetaminophen (Tylenol, etc) 500-65044mour times a day (every meal & bedtime) d. A  prescription for pain medication (such as oxycodone, hydrocodone, etc) should be given to you upon discharge.  Take your pain medication as prescribed.  i. If you are having problems/concerns with the prescription medicine (does not control pain, nausea, vomiting, rash, itching, etc), please call us [phone] see if we need to switch you to a different pain medicine that will work better for you and/or control your side effect better. ii. If you need a refill on your pain medication, please contact your pharmacy.  They will contact our office to request authorization. Prescriptions will not be filled after 5 pm or on week-ends. 4. Avoid getting constipated.  Between the surgery and the pain medications, it is common to experience some constipation.  Increasing fluid intake and taking a fiber supplement (such as Metamucil, Citrucel, FiberCon, MiraLax, etc) 1-2 times a day regularly will usually help prevent this problem from occurring.  A mild laxative (prune juice, Milk of Magnesia, MiraLax, etc) should be taken according to package directions if there are no bowel movements after 48 hours.   5. Watch out for diarrhea.  If you have many loose bowel movements, simplify your diet to bland foods & liquids for  a few days.  Stop any stool softeners and decrease your fiber supplement.  Switching to mild anti-diarrheal medications (Kayopectate, Pepto Bismol) can help.  If this worsens or does not improve, please call us. 6. Wash / shower every day.  You may shower over skin glue which is waterproof.  Continue to shower over incision(s) after the  dressing is off. 7. Skin glue will flake off after about 2 weeks.  You may leave the incision open to air.  You may replace a dressing/Band-Aid to cover the incision for comfort if you wish.  8. ACTIVITIES as tolerated:   a. You may resume regular (light) daily activities beginning the next day--such as daily self-care, walking, climbing stairs--gradually increasing activities as tolerated.  If you can walk 30 minutes without difficulty, it is safe to try more intense activity such as jogging, treadmill, bicycling, low-impact aerobics, swimming, etc. b. Save the most intensive and strenuous activity for last such as sit-ups, heavy lifting, contact sports, etc  Refrain from any heavy lifting or straining until you are off narcotics for pain control.   c. DO NOT PUSH THROUGH PAIN.  Let pain be your guide: If it hurts to do something, don't do it.  Pain is your body warning you to avoid that activity for another week until the pain goes down. d. You may drive when you are no longer taking prescription pain medication, you can comfortably wear a seatbelt, and you can safely maneuver your car and apply brakes. e. Bonita Quin may have sexual intercourse when it is comfortable.  9. FOLLOW UP in our office a. Please call [facility] at [phone] to set up an appointment to see your surgeon in the office for a follow-up appointment approximately 2-3 weeks after your surgery. b. Make sure that you call for this appointment the day you arrive home to insure a convenient appointment time. 10. IF YOU HAVE DISABILITY OR FAMILY LEAVE FORMS, BRING THEM TO THE OFFICE FOR PROCESSING.  DO NOT GIVE THEM TO YOUR DOCTOR.   WHEN TO CALL us [phone]: 1. Poor pain control 2. Reactions / problems with new medications (rash/itching, nausea, etc)  3. Fever over 101.5 F (38.5 C) 4. Inability to urinate 5. Nausea and/or vomiting 6. Worsening right upper abdominal pain or jaundice 7. Worsening swelling or bruising 8. Continued  bleeding from incision. 9. Increased pain, redness, or drainage from the incision   The clinic staff is available to answer your questions during regular business hours (8:30am-5pm).  Please dont hesitate to call and ask to speak to one of our nurses for clinical concerns.   If you have a medical emergency, go to the nearest emergency room or call 911.  A surgeon from [facility] is always on call at the [facility], [place] [address], [place], [place]  [address] ? MAIN: [phone] ? TOLL FREE: [phone] ?  FAX [phone] [email or web address]

## 2016-12-20 NOTE — Transfer of Care (Signed)
Immediate Anesthesia Transfer of Care Note  Patient: Harold Freeman  Procedure(s) Performed: LAPAROSCOPIC CHOLECYSTECTOMY (N/A Abdomen)  Patient Location: PACU  Anesthesia Type:General  Level of Consciousness: awake, oriented, drowsy, patient cooperative and responds to stimulation  Airway & Oxygen Therapy: Patient Spontanous Breathing and Patient connected to face mask oxygen  Post-op Assessment: Report given to RN, Post -op Vital signs reviewed and stable and Patient moving all extremities  Post vital signs: Reviewed and stable  Last Vitals:  Vitals:   12/20/16 1040 12/20/16 1452  BP: (!) 154/93 (!) 151/96  Pulse:  64  Resp:  13  Temp:  36.7 C  SpO2:  97%    Last Pain:  Vitals:   12/20/16 1026  TempSrc: Oral      Patients Stated Pain Goal: 3 (12/20/16 1031)  Complications: No apparent anesthesia complications

## 2016-12-20 NOTE — Op Note (Signed)
Operative Note  Harold Freeman 46 y.o. male [id number]  12/20/2016  Surgeon: Berna Bue MD  Assistant: OR staff  Procedure performed: Laparoscopic Cholecystectomy  Preop diagnosis: biliary colic Post-op diagnosis/intraop findings: same  Specimens: gallbladder  EBL: minimal  Complications: none  Description of procedure: After obtaining informed consent the patient was brought to the operating room. Prophylactic antibiotics and subcutaneous heparin were administered. SCD's were applied. General endotracheal anesthesia was initiated and a formal time-out was performed. The abdomen was prepped and draped in the usual sterile fashion and the abdomen was entered using an infraumbilical veress needle after instilling the site with local. Insufflation to was obtained, 62mm camera and trocar placed and gross inspection revealed no evidence of injury from our entry or other intraabdominal abnormalities. Two 63mm trocars were introduced in the right midclavicular and right anterior axillary lines under direct visualization and following infiltration with local. An 23mm trocar was placed in the epigastrium to the patient's right of the falciform. The gallbladder was retracted cephalad and the infundibulum was retracted laterally. A combination of hook electrocautery and blunt dissection was utilized to clear the peritoneum from the neck and cystic duct, circumferentially isolating the cystic artery and cystic duct and lifting the gallbladder from the cystic plate. The cystic duct was very matted with fibrotic tissue distally. The critical view of safety was achieved with the cystic artery, cystic duct, and liver bed visualized between them with no other structures. The artery was clipped with two clips proximally and one distally and divided as was the cystic duct with three clips on the proximal end. The gallbladder was dissected from the liver plate using electrocautery. Once freed the  gallbladder was placed in an endocatch bag and removed intact through the epigastric trocar site. The right upper quadrant was irrigated copiously and the effluent was clear. Hemostasis was once again confirmed, and reinspection of the abdomen revealed no injuries. The clips were well opposed without any bile leak from the duct or the liver bed. The cystic duct was patulous and I placed a 4th clip on it to minimize risk of leak. The 60mm trocar site in the epigastrium was closed with a 0 vicryl in the fascia under direct visualization using a PMI device. The abdomen was desufflated and all trocars removed. The skin incisions were closed with running subcuticular monocryl and Dermabond. The patient was awakened, extubated and transported to the recovery room in stable condition.   All counts were correct at the completion of the case.

## 2016-12-21 ENCOUNTER — Encounter (HOSPITAL_COMMUNITY): Payer: Self-pay | Admitting: Surgery

## 2016-12-21 NOTE — Anesthesia Postprocedure Evaluation (Signed)
Anesthesia Post Note  Patient: Harold Freeman  Procedure(s) Performed: LAPAROSCOPIC CHOLECYSTECTOMY (N/A Abdomen)     Patient location during evaluation: PACU Anesthesia Type: General Level of consciousness: awake and alert Pain management: pain level controlled Vital Signs Assessment: post-procedure vital signs reviewed and stable Respiratory status: spontaneous breathing, nonlabored ventilation, respiratory function stable and patient connected to nasal cannula oxygen Cardiovascular status: blood pressure returned to baseline and stable Postop Assessment: no apparent nausea or vomiting Anesthetic complications: no    Last Vitals:  Vitals:   12/20/16 1645 12/20/16 1715  BP: (!) 162/89 (!) 166/97  Pulse: 60 60  Resp: 14 14  Temp:    SpO2: 97% 98%    Last Pain:  Vitals:   12/20/16 1615  TempSrc:   PainSc: Asleep                 Chetan Mehring

## 2017-02-07 ENCOUNTER — Ambulatory Visit: Payer: Medicare Other | Admitting: Internal Medicine

## 2017-04-11 ENCOUNTER — Ambulatory Visit (INDEPENDENT_AMBULATORY_CARE_PROVIDER_SITE_OTHER): Payer: Medicare Other

## 2017-04-11 ENCOUNTER — Encounter: Payer: Self-pay | Admitting: Podiatry

## 2017-04-11 ENCOUNTER — Ambulatory Visit (INDEPENDENT_AMBULATORY_CARE_PROVIDER_SITE_OTHER): Payer: Medicare Other | Admitting: Podiatry

## 2017-04-11 ENCOUNTER — Other Ambulatory Visit: Payer: Self-pay | Admitting: Podiatry

## 2017-04-11 VITALS — BP 171/94 | HR 73 | Resp 16

## 2017-04-11 DIAGNOSIS — L03032 Cellulitis of left toe: Secondary | ICD-10-CM

## 2017-04-11 DIAGNOSIS — L02612 Cutaneous abscess of left foot: Secondary | ICD-10-CM | POA: Diagnosis not present

## 2017-04-11 DIAGNOSIS — G8929 Other chronic pain: Secondary | ICD-10-CM

## 2017-04-11 DIAGNOSIS — M79675 Pain in left toe(s): Secondary | ICD-10-CM

## 2017-04-11 NOTE — Progress Notes (Signed)
Subjective:   Patient ID: Harold Freeman, male   DOB: 47 y.o.   MRN: [id number]   HPI Patient presents stating the end of my second toe over the last 2 weeks has become discolored and was draining.  Patient states that he is currently taking Bactrim for this and is been doing okay but he is concerned about the fact there is still has discoloration of the toe.  Patient does not smoke and likes to be active.  Patient has diabetes for approximately 5 years and is under reasonably good control and does have primary kidney disease   Review of Systems  All other systems reviewed and are negative.       Objective:  Physical Exam  Constitutional: He appears well-developed and well-nourished.  Cardiovascular: Intact distal pulses.  Pulmonary/Chest: Effort normal.  Musculoskeletal: Normal range of motion.  Neurological: He is alert.  Skin: Skin is warm.  Nursing note and vitals reviewed.   Neurovascular status found to be intact muscle strength was adequate with range of motion within normal limits.  Moderate diminishment sharp dull vibratory noted distal and patient has an elongated second digit left with structural bunion deformity to been corrected when he was a teenager.  There is a keratotic lesion distal aspect digit toe left with slight breakdown of tissue that is localized with no proximal edema erythema or drainage currently noted.  No systemic signs of infection and patient does have good digital perfusion     Assessment:  Stress to the second digit left with elongated toe with chronic keratotic lesion slight breakdown of tissue and probable low-grade localized infection with possibility for bone infection present     Plan:  H&P x-rays reviewed and today using sharp sterile instrumentation debridement accomplished I cleaned the tissue flush the area applied bandages applied padding to the toe and applied buttress pad to lift the toe in the air.  It appears to be localized and I am  hopeful it will heal but I did discuss the possibility long-term that there could be bone infection and that ultimately amputation of the digit may be required.  At this point we will keep a very close eye on it and will see him back in 3 weeks or earlier if any issues were to occur  X-rays indicate E elongated second digit left with difficulty to say whether or not there may be any distal osteomyelitic changes.  The middle and proximal phalanx looked healthy

## 2017-05-02 ENCOUNTER — Ambulatory Visit: Payer: Medicare Other | Admitting: Podiatry

## 2017-05-09 ENCOUNTER — Ambulatory Visit: Payer: Medicare Other | Admitting: Podiatry

## 2017-06-27 ENCOUNTER — Ambulatory Visit (INDEPENDENT_AMBULATORY_CARE_PROVIDER_SITE_OTHER): Payer: Medicare Other | Admitting: Podiatry

## 2017-06-27 ENCOUNTER — Encounter: Payer: Self-pay | Admitting: Podiatry

## 2017-06-27 DIAGNOSIS — M79675 Pain in left toe(s): Secondary | ICD-10-CM

## 2017-06-27 DIAGNOSIS — L02612 Cutaneous abscess of left foot: Secondary | ICD-10-CM

## 2017-06-27 DIAGNOSIS — L03032 Cellulitis of left toe: Secondary | ICD-10-CM

## 2017-06-27 DIAGNOSIS — G8929 Other chronic pain: Secondary | ICD-10-CM | POA: Diagnosis not present

## 2017-06-30 NOTE — Progress Notes (Signed)
Subjective:   Patient ID: Harold Freeman, male   DOB: 47 y.o.   MRN: [id number]   HPI Patient presents with chronic digital deformity left with elongated toes keratotic lesions that are painful when palpated   ROS      Objective:  Physical Exam  Neurovascular status unchanged with patient found to have structural changes to the digits with elongated toes keratotic lesion formation that is painful when pressed and hard to wear shoe gear with comfortably     Assessment:  Chronic keratotic lesion with nail involvement and structural changes of the digits creating this overall problem     Plan:  H&P reviewed condition and at this point debridement of lesions accomplished with padding and this is the best we can do short of surgery which I educated him on today with possible digital fusion procedure which may be necessary in future

## 2017-07-03 ENCOUNTER — Emergency Department (HOSPITAL_BASED_OUTPATIENT_CLINIC_OR_DEPARTMENT_OTHER): Payer: Medicare Other

## 2017-07-03 ENCOUNTER — Encounter (HOSPITAL_BASED_OUTPATIENT_CLINIC_OR_DEPARTMENT_OTHER): Payer: Self-pay

## 2017-07-03 ENCOUNTER — Other Ambulatory Visit: Payer: Self-pay

## 2017-07-03 ENCOUNTER — Emergency Department (HOSPITAL_BASED_OUTPATIENT_CLINIC_OR_DEPARTMENT_OTHER)
Admission: EM | Admit: 2017-07-03 | Discharge: 2017-07-03 | Disposition: A | Discharge status: Home / Self Care | Payer: Medicare Other | Attending: Emergency Medicine | Admitting: Emergency Medicine

## 2017-07-03 DIAGNOSIS — R1030 Lower abdominal pain, unspecified: Secondary | ICD-10-CM | POA: Diagnosis present

## 2017-07-03 DIAGNOSIS — N186 End stage renal disease: Secondary | ICD-10-CM | POA: Insufficient documentation

## 2017-07-03 DIAGNOSIS — Z79899 Other long term (current) drug therapy: Secondary | ICD-10-CM | POA: Insufficient documentation

## 2017-07-03 DIAGNOSIS — R197 Diarrhea, unspecified: Secondary | ICD-10-CM

## 2017-07-03 DIAGNOSIS — E119 Type 2 diabetes mellitus without complications: Secondary | ICD-10-CM | POA: Insufficient documentation

## 2017-07-03 DIAGNOSIS — I509 Heart failure, unspecified: Secondary | ICD-10-CM | POA: Insufficient documentation

## 2017-07-03 DIAGNOSIS — K5732 Diverticulitis of large intestine without perforation or abscess without bleeding: Secondary | ICD-10-CM

## 2017-07-03 DIAGNOSIS — Z992 Dependence on renal dialysis: Secondary | ICD-10-CM | POA: Insufficient documentation

## 2017-07-03 DIAGNOSIS — I132 Hypertensive heart and chronic kidney disease with heart failure and with stage 5 chronic kidney disease, or end stage renal disease: Secondary | ICD-10-CM | POA: Diagnosis not present

## 2017-07-03 DIAGNOSIS — Z87891 Personal history of nicotine dependence: Secondary | ICD-10-CM | POA: Insufficient documentation

## 2017-07-03 DIAGNOSIS — K5730 Diverticulosis of large intestine without perforation or abscess without bleeding: Secondary | ICD-10-CM | POA: Insufficient documentation

## 2017-07-03 LAB — CBC WITH DIFFERENTIAL/PLATELET
Basophils Absolute: 0 K/uL (ref 0.0–0.1)
Basophils Relative: 0 %
Eosinophils Absolute: 0.1 K/uL (ref 0.0–0.7)
Eosinophils Relative: 1 %
HCT: 37.4 % — ABNORMAL LOW (ref 39.0–52.0)
Hemoglobin: 12.9 g/dL — ABNORMAL LOW (ref 13.0–17.0)
Lymphocytes Relative: 15 %
Lymphs Abs: 1.4 K/uL (ref 0.7–4.0)
MCH: 32.9 pg (ref 26.0–34.0)
MCHC: 34.5 g/dL (ref 30.0–36.0)
MCV: 95.4 fL (ref 78.0–100.0)
Monocytes Absolute: 0.6 K/uL (ref 0.1–1.0)
Monocytes Relative: 7 %
Neutro Abs: 7 K/uL (ref 1.7–7.7)
Neutrophils Relative %: 77 %
Platelets: 147 K/uL — ABNORMAL LOW (ref 150–400)
RBC: 3.92 MIL/uL — ABNORMAL LOW (ref 4.22–5.81)
RDW: 13.4 % (ref 11.5–15.5)
WBC: 9.2 K/uL (ref 4.0–10.5)

## 2017-07-03 LAB — COMPREHENSIVE METABOLIC PANEL WITH GFR
ALT: 51 U/L (ref 17–63)
AST: 27 U/L (ref 15–41)
Albumin: 3.9 g/dL (ref 3.5–5.0)
Alkaline Phosphatase: 90 U/L (ref 38–126)
Anion gap: 13 (ref 5–15)
BUN: 34 mg/dL — ABNORMAL HIGH (ref 6–20)
CO2: 29 mmol/L (ref 22–32)
Calcium: 8.4 mg/dL — ABNORMAL LOW (ref 8.9–10.3)
Chloride: 93 mmol/L — ABNORMAL LOW (ref 101–111)
Creatinine, Ser: 10.69 mg/dL — ABNORMAL HIGH (ref 0.61–1.24)
GFR calc Af Amer: 6 mL/min — ABNORMAL LOW
GFR calc non Af Amer: 5 mL/min — ABNORMAL LOW
Glucose, Bld: 155 mg/dL — ABNORMAL HIGH (ref 65–99)
Potassium: 4 mmol/L (ref 3.5–5.1)
Sodium: 135 mmol/L (ref 135–145)
Total Bilirubin: 0.3 mg/dL (ref 0.3–1.2)
Total Protein: 7.9 g/dL (ref 6.5–8.1)

## 2017-07-03 LAB — LIPASE, BLOOD: Lipase: 40 U/L (ref 11–51)

## 2017-07-03 MED ORDER — METRONIDAZOLE 500 MG PO TABS
500.0000 mg | ORAL_TABLET | Freq: Once | ORAL | Status: AC
  Administered 2017-07-03: 500 mg via ORAL
  Filled 2017-07-03: qty 1

## 2017-07-03 MED ORDER — CIPROFLOXACIN HCL 500 MG PO TABS
500.0000 mg | ORAL_TABLET | Freq: Once | ORAL | Status: AC
Start: 2017-07-03 — End: 2017-07-03
  Administered 2017-07-03: 500 mg via ORAL
  Filled 2017-07-03: qty 1

## 2017-07-03 MED ORDER — METRONIDAZOLE 500 MG PO TABS: 500.0000 mg | ORAL_TABLET | Freq: Two times a day (BID) | ORAL | 0 refills | Status: DC

## 2017-07-03 MED ORDER — CIPROFLOXACIN HCL 500 MG PO TABS: 500.0000 mg | ORAL_TABLET | Freq: Every day | ORAL | 0 refills | Status: AC

## 2017-07-03 NOTE — ED Provider Notes (Signed)
Emergency Department Provider Note   I have reviewed the triage vital signs and the nursing notes.   HISTORY  Chief Complaint Abdominal Pain   HPI Harold Freeman is a 47 y.o. male with PMH of CHF, CAD, ESRD on HD, HTN, HLD presents to the ED with diarrhea and lower abdominal pain. Patient reports frequent diarrhea, every 2-3 weeks, with his HD ESRD meds. Symptoms began on Monday without pain or blood in the stool. He went to HD Monday with no complications but symptoms worsened over the next several days. Patient developed pain in the lower abdomen with frequent diarrhea. He went to HD today but had to leave early due to pain and diarrhea frequency. No fever. Symptoms worse with eating. No blood in the stool. No history of C. Diff. No recent travel. No recent abx.   Past Medical History:  Diagnosis Date  . Anxiety   . Bronchitis    hx of  . CHF (congestive heart failure) (HCC)   . Coronary artery disease    by 07/04/12 cath: 40% LAD, 90% RCA; medical management  . Diabetes mellitus    type 2  . Dialysis patient (HCC)   . Diarrhea   . GERD (gastroesophageal reflux disease)   . Gout   . History of kidney stones   . Hyperlipidemia   . Hypertension   . Kidney stones   . Low iron   . Nonischemic cardiomyopathy (HCC)    EF 35-40% by 09/06/16 echo at [facility]  . Obesity   . Palpitations   . Pneumonia   . Polycystic kidney disease    ESRD w/ fistula; dialysis M/W/F  . Restless legs   . Sleep apnea    unable to use cpap  . Tobacco abuse     Patient Active Problem List   Diagnosis Date Noted  . Obstructive sleep apnea 07/31/2016  . Epistaxis 07/31/2016  . Diabetes (HCC) 01/13/2015  . Hyperlipidemia 01/13/2015  . Nonischemic cardiomyopathy (HCC) 01/13/2015  . Palpitations 01/13/2015  . Morbid obesity (HCC) 11/30/2011  . HTN (hypertension) 11/30/2011  . End stage renal disease (HCC) 04/16/2011    Past Surgical History:  Procedure Laterality Date  . ANKLE FRACTURE  SURGERY     47 years old  . AV FISTULA PLACEMENT  05/18/2011   Procedure: ARTERIOVENOUS (AV) FISTULA CREATION;  Surgeon: Nada Libman, MD;  Location: [facility] OR;  Service: Vascular;  Laterality: Left;  Left wrist fistula; ultrasound guided.  [place]as     as a teenager  . CARDIAC CATHETERIZATION    . CHOLECYSTECTOMY N/A 12/20/2016   Procedure: LAPAROSCOPIC CHOLECYSTECTOMY;  Surgeon: Berna Bue, MD;  Location: [facility] OR;  Service: General;  Laterality: N/A;  . KNEE ARTHROSCOPY W/ ACL RECONSTRUCTION    . LEFT HEART CATHETERIZATION WITH CORONARY ANGIOGRAM N/A 07/04/2012   Procedure: LEFT HEART CATHETERIZATION WITH CORONARY ANGIOGRAM;  Surgeon: Dolores Patty, MD;  Location: [facility] CATH LAB;  Service: Cardiovascular;  Laterality: N/A;    Current Outpatient Rx  . Order #: [id number] Class: Historical Med  . Order #: [id number] Class: Historical Med  . Order #: [id number] Class: Historical Med  . Order #: [id number] Class: Historical Med  . Order #: [id number] Class: Historical Med  . Order #: [id number] Class: Historical Med  . Order #: [id number] Class: Historical Med  . Order #: [id number] Class: Historical Med  . Order #: [id number] Class: Historical Med  . Order #: [id number] Class: Historical Med  . Order #: [id number] Class: Historical Med  . Order #: [id number] Class: Historical  Med  . Order #: [id number] Class: Print  . Order #: [id number] Class: Print  . Order #: [id number] Class: Historical Med  . Order #: [id number] Class: Print  . Order #: [id number] Class: Print  . Order #: [id number] Class: Historical Med    Allergies Mushroom extract complex  Family History  Problem Relation Age of Onset  . Diabetes Mother   . Hypertension Mother   . Kidney disease Mother        Polycystic kidney disease  . Cancer Father   . Diabetes Father   . Hypertension Father   . Diabetes Sister     Social History Social History   Tobacco Use  . Smoking status: Former Smoker    Packs/day: 1.00    Years: 26.00    Pack  years: 26.00    Types: Cigarettes    Last attempt to quit: 06/23/2015    Years since quitting: 2.0  . Smokeless tobacco: Never Used  Substance Use Topics  . Alcohol use: No    Comment: Used to be once a week but now very infrequently since starting dialysis 01/2012  . Drug use: No    Review of Systems  Constitutional: No fever/chills Eyes: No visual changes. ENT: No sore throat. Cardiovascular: Denies chest pain. Respiratory: Denies shortness of breath. Gastrointestinal: Positive LLQ and suprapubic abdominal pain.  No nausea, no vomiting. Positive diarrhea.  No constipation. Genitourinary: Negative for dysuria. Musculoskeletal: Negative for back pain. Skin: Negative for rash. Neurological: Negative for headaches, focal weakness or numbness.  10-point ROS otherwise negative.  ____________________________________________   PHYSICAL EXAM:  VITAL SIGNS: ED Triage Vitals  Enc Vitals Group     BP 07/03/17 1856 (!) 173/103     Pulse Rate 07/03/17 1856 88     Resp 07/03/17 1856 18     Temp 07/03/17 1856 98.4 F (36.9 C)     Temp Source 07/03/17 1856 Oral     SpO2 07/03/17 1856 97 %     Weight 07/03/17 1858 271 lb 2.7 oz (123 kg)     Height 07/03/17 1858 6\' 1"  (1.854 m)     Pain Score 07/03/17 1856 5   Constitutional: Alert and oriented. Well appearing and in no acute distress. Eyes: Conjunctivae are normal.  Head: Atraumatic. Nose: No congestion/rhinnorhea. Mouth/Throat: Mucous membranes are moist.   Neck: No stridor.  Cardiovascular: Normal rate, regular rhythm. Good peripheral circulation. Grossly normal heart sounds.   Respiratory: Normal respiratory effort.  No retractions. Lungs CTAB. Gastrointestinal: Soft with suprapubic and LLQ tenderness. No rebound or guarding. No distention.  Musculoskeletal: No lower extremity tenderness nor edema. No gross deformities of extremities. Neurologic:  Normal speech and language. No gross focal neurologic deficits are appreciated.    Skin:  Skin is warm, dry and intact. No rash noted.  ____________________________________________   LABS (all labs ordered are listed, but only abnormal results are displayed)  Labs Reviewed  COMPREHENSIVE METABOLIC PANEL - Abnormal; Notable for the following components:      Result Value   Chloride 93 (*)    Glucose, Bld 155 (*)    BUN 34 (*)    Creatinine, Ser 10.69 (*)    Calcium 8.4 (*)    GFR calc non Af Amer 5 (*)    GFR calc Af Amer 6 (*)    All other components within normal limits  CBC WITH DIFFERENTIAL/PLATELET - Abnormal; Notable for the following components:   RBC 3.92 (*)    Hemoglobin 12.9 (*)    HCT 37.4 (*)  Platelets 147 (*)    All other components within normal limits  LIPASE, BLOOD   ____________________________________________  RADIOLOGY  Ct Abdomen Pelvis Wo Contrast  Result Date: 07/03/2017 CLINICAL DATA:  Abdominal pain and diarrhea x3 days. EXAM: CT ABDOMEN AND PELVIS WITHOUT CONTRAST TECHNIQUE: Multidetector CT imaging of the abdomen and pelvis was performed following the standard protocol without IV contrast. COMPARISON:  12/21/2015 FINDINGS: Lower chest: Normal size heart. Left circumflex coronary arteriosclerosis. No pericardial effusion. Clear lung bases. Hepatobiliary: Hepatic steatosis. Cholecystectomy. No biliary dilatation or focal liver abnormality given limitations of a noncontrast study. Pancreas: Normal Spleen: Normal Adrenals/Urinary Tract: Normal bilateral adrenal glands. Redemonstration of polycystic kidneys with variable attenuating cysts as before, some of the hyperdense cysts likely representing hemorrhagic or proteinaceous cysts. No hydroureteronephrosis. Normal bladder. Stomach/Bowel: Normal stomach and small intestine. Pericolonic inflammation along the proximal and mid sigmoid colon secondary to acute uncomplicated diverticulitis. No abscess or free air. Vascular/Lymphatic: Aortic atherosclerosis. No enlarged abdominal or pelvic lymph  nodes. Reproductive: Prostate is unremarkable. Other: No abdominal wall hernia or abnormality. No abdominopelvic ascites. Musculoskeletal: Redemonstration of degenerative disc disease at L4-5 with inferior endplate Schmorl's nodes of L4. Slight disc space narrowing noted of L5-S1 as well. IMPRESSION: 1. Acute uncomplicated sigmoid diverticulitis. 2. Polycystic kidneys are redemonstrated with simple and complex cysts noted as before. 3. Hepatic steatosis. 4. Coronary arteriosclerosis. Aortic atherosclerosis without aneurysm. Electronically Signed   By: Tollie Eth M.D.   On: 07/03/2017 21:48    ____________________________________________   PROCEDURES  Procedure(s) performed:   Procedures  None ____________________________________________   INITIAL IMPRESSION / ASSESSMENT AND PLAN / ED COURSE  Pertinent labs & imaging results that were available during my care of the patient were reviewed by me and considered in my medical decision making (see chart for details).  Patient presents to the emergency department for evaluation of worsening diarrhea and lower abdominal pain over the last 3 days.  He has focal tenderness in the suprapubic and left lower quadrant regions.  Patient has no recent antibiotic use.  He is dialysis patient but has an otherwise low risk for C. Difficile.  Plan for noncontrast CT abdomen pelvis to evaluate for colitis/diverticulitis. Patient has a prescription for lomotil and will get that filled today if no bacterial cause if found.   CT imaging reviewed. Patient with acute diverticulitis. Will start Cipro/Flagyl at ESRD adjusted dose. Discussed ED return precautions in detail.   At this time, I do not feel there is any life-threatening condition present. I have reviewed and discussed all results (EKG, imaging, lab, urine as appropriate), exam findings with patient. I have reviewed nursing notes and appropriate previous records.  I feel the patient is safe to be discharged  home without further emergent workup. Discussed usual and customary return precautions. Patient and family (if present) verbalize understanding and are comfortable with this plan.  Patient will follow-up with their primary care provider. If they do not have a primary care provider, information for follow-up has been provided to them. All questions have been answered.  ____________________________________________  FINAL CLINICAL IMPRESSION(S) / ED DIAGNOSES  Final diagnoses:  Diverticulitis of large intestine without perforation or abscess without bleeding  Diarrhea of presumed infectious origin     MEDICATIONS GIVEN DURING THIS VISIT:  Medications  ciprofloxacin (CIPRO) tablet 500 mg (500 mg Oral Given 07/03/17 2221)  metroNIDAZOLE (FLAGYL) tablet 500 mg (500 mg Oral Given 07/03/17 2221)     NEW OUTPATIENT MEDICATIONS STARTED DURING THIS VISIT:  Discharge Medication  List as of 07/03/2017 10:48 PM    START taking these medications   Details  ciprofloxacin (CIPRO) 500 MG tablet Take 1 tablet (500 mg total) by mouth daily for 7 days., Starting Wed 07/03/2017, Until Wed 07/10/2017, Print    metroNIDAZOLE (FLAGYL) 500 MG tablet Take 1 tablet (500 mg total) by mouth 2 (two) times daily., Starting Wed 07/03/2017, Print        Note:  This document was prepared using Dragon voice recognition software and may include unintentional dictation errors.  Alona Bene, MD Emergency Medicine    Jaide Hillenburg, Arlyss Repress, MD 07/04/17 1139

## 2017-07-03 NOTE — Discharge Instructions (Addendum)
We believe your symptoms are caused by diverticulitis.  Most of the time this condition (please read through the included information) can be cured with outpatient antibiotics.  Please take the full course of prescribed medication(s) and follow up with the doctors recommended above. ° °Return to the ED if your abdominal pain worsens or fails to improve, you develop bloody vomiting, bloody diarrhea, you are unable to tolerate fluids due to vomiting, fever greater than 101, or other symptoms that concern you. °  °Diverticulitis °Diverticulitis is inflammation or infection of small pouches in your colon that form when you have a condition called diverticulosis. The pouches in your colon are called diverticula. Your colon, or large intestine, is where water is absorbed and stool is formed. °Complications of diverticulitis can include: °Bleeding. °Severe infection. °Severe pain. °Perforation of your colon. °Obstruction of your colon. °CAUSES  °Diverticulitis is caused by bacteria. °Diverticulitis happens when stool becomes trapped in diverticula. This allows bacteria to grow in the diverticula, which can lead to inflammation and infection. °RISK FACTORS °People with diverticulosis are at risk for diverticulitis. Eating a diet that does not include enough fiber from fruits and vegetables may make diverticulitis more likely to develop. °SYMPTOMS  °Symptoms of diverticulitis may include: °Abdominal pain and tenderness. The pain is normally located on the left side of the abdomen, but may occur in other areas. °Fever and chills. °Bloating. °Cramping. °Nausea. °Vomiting. °Constipation. °Diarrhea. °Blood in your stool. °DIAGNOSIS  °Your health care provider will ask you about your medical history and do a physical exam. You may need to have tests done because many medical conditions can cause the same symptoms as diverticulitis. Tests may include: °Blood tests. °Urine tests. °Imaging tests of the abdomen, including X-rays and  CT scans. °When your condition is under control, your health care provider may recommend that you have a colonoscopy. A colonoscopy can show how severe your diverticula are and whether something else is causing your symptoms. °TREATMENT  °Most cases of diverticulitis are mild and can be treated at home. Treatment may include: °Taking over-the-counter pain medicines. °Following a clear liquid diet. °Taking antibiotic medicines by mouth for 7-10 days. °More severe cases may be treated at a hospital. Treatment may include: °Not eating or drinking. °Taking prescription pain medicine. °Receiving antibiotic medicines through an IV tube. °Receiving fluids and nutrition through an IV tube. °Surgery. °HOME CARE INSTRUCTIONS  °Follow your health care provider's instructions carefully. °Follow a full liquid diet or other diet as directed by your health care provider. After your symptoms improve, your health care provider may tell you to change your diet. He or she may recommend you eat a high-fiber diet. Fruits and vegetables are good sources of fiber. Fiber makes it easier to pass stool. °Take fiber supplements or probiotics as directed by your health care provider. °Only take medicines as directed by your health care provider. °Keep all your follow-up appointments. °SEEK MEDICAL CARE IF:  °Your pain does not improve. °You have a hard time eating food. °Your bowel movements do not return to normal. °SEEK IMMEDIATE MEDICAL CARE IF:  °Your pain becomes worse. °Your symptoms do not get better. °Your symptoms suddenly get worse. °You have a fever. °You have repeated vomiting. °You have bloody or black, tarry stools. °MAKE SURE YOU:  °Understand these instructions. °Will watch your condition. °Will get help right away if you are not doing well or get worse. °Document Released: 10/18/2004 Document Revised: 01/13/2013 Document Reviewed: 12/03/2012 °[employer]® Patient Information ©2015 [employer].   This information is not intended  to replace advice given to you by your health care provider. Make sure you discuss any questions you have with your health care provider. ° ° °

## 2017-07-03 NOTE — ED Triage Notes (Signed)
C/o abd pain, diarrhea x 3 days-pt sates he has hx of chronic diarrhea but worse since Monday-pt states he is a dialysis pt and had dialysis just before arrival-had to stop due to cont'd diarrhea-NAD-steady gait

## 2017-08-26 ENCOUNTER — Emergency Department (HOSPITAL_BASED_OUTPATIENT_CLINIC_OR_DEPARTMENT_OTHER)
Admission: EM | Admit: 2017-08-26 | Discharge: 2017-08-27 | Disposition: A | Discharge status: Home / Self Care | Payer: Medicare Other | Attending: Emergency Medicine | Admitting: Emergency Medicine

## 2017-08-26 ENCOUNTER — Other Ambulatory Visit: Payer: Self-pay

## 2017-08-26 ENCOUNTER — Encounter (HOSPITAL_BASED_OUTPATIENT_CLINIC_OR_DEPARTMENT_OTHER): Payer: Self-pay

## 2017-08-26 DIAGNOSIS — Z79899 Other long term (current) drug therapy: Secondary | ICD-10-CM | POA: Insufficient documentation

## 2017-08-26 DIAGNOSIS — Y69 Unspecified misadventure during surgical and medical care: Secondary | ICD-10-CM | POA: Diagnosis not present

## 2017-08-26 DIAGNOSIS — Z7982 Long term (current) use of aspirin: Secondary | ICD-10-CM | POA: Insufficient documentation

## 2017-08-26 DIAGNOSIS — I2510 Atherosclerotic heart disease of native coronary artery without angina pectoris: Secondary | ICD-10-CM | POA: Insufficient documentation

## 2017-08-26 DIAGNOSIS — I509 Heart failure, unspecified: Secondary | ICD-10-CM | POA: Diagnosis not present

## 2017-08-26 DIAGNOSIS — Z87891 Personal history of nicotine dependence: Secondary | ICD-10-CM | POA: Diagnosis not present

## 2017-08-26 DIAGNOSIS — I132 Hypertensive heart and chronic kidney disease with heart failure and with stage 5 chronic kidney disease, or end stage renal disease: Secondary | ICD-10-CM | POA: Insufficient documentation

## 2017-08-26 DIAGNOSIS — Z992 Dependence on renal dialysis: Secondary | ICD-10-CM | POA: Diagnosis not present

## 2017-08-26 DIAGNOSIS — N186 End stage renal disease: Secondary | ICD-10-CM | POA: Insufficient documentation

## 2017-08-26 DIAGNOSIS — T82838A Hemorrhage of vascular prosthetic devices, implants and grafts, initial encounter: Secondary | ICD-10-CM | POA: Diagnosis present

## 2017-08-26 NOTE — ED Triage Notes (Signed)
Pt states his left FA dialysis catheter was bleeding at dialysis today-bleeding stopped then started again approx 30 min PTA-pt has 2x2 dsg intact with no bleed thru-4x4/kling added-pt advised to let staff know if bleeding starts again-NAD-steady gait

## 2017-08-27 DIAGNOSIS — T82838A Hemorrhage of vascular prosthetic devices, implants and grafts, initial encounter: Secondary | ICD-10-CM | POA: Diagnosis not present

## 2017-08-27 LAB — CBC WITH DIFFERENTIAL/PLATELET
BASOS ABS: 0 10*3/uL (ref 0.0–0.1)
BASOS PCT: 1 %
EOS ABS: 0.3 10*3/uL (ref 0.0–0.7)
EOS PCT: 3 %
HCT: 36.1 % — ABNORMAL LOW (ref 39.0–52.0)
Hemoglobin: 12.8 g/dL — ABNORMAL LOW (ref 13.0–17.0)
LYMPHS ABS: 1.5 10*3/uL (ref 0.7–4.0)
Lymphocytes Relative: 18 %
MCH: 34.3 pg — ABNORMAL HIGH (ref 26.0–34.0)
MCHC: 35.5 g/dL (ref 30.0–36.0)
MCV: 96.8 fL (ref 78.0–100.0)
Monocytes Absolute: 0.6 10*3/uL (ref 0.1–1.0)
Monocytes Relative: 8 %
NEUTROS PCT: 70 %
Neutro Abs: 5.7 10*3/uL (ref 1.7–7.7)
PLATELETS: 169 10*3/uL (ref 150–400)
RBC: 3.73 MIL/uL — ABNORMAL LOW (ref 4.22–5.81)
RDW: 13.9 % (ref 11.5–15.5)
WBC: 8 10*3/uL (ref 4.0–10.5)

## 2017-08-27 NOTE — ED Notes (Signed)
Pt verbalizes understanding of d/c instructions and denies any further needs at this time. 

## 2017-08-27 NOTE — Discharge Instructions (Addendum)
If bleeding recurs, hold pressure directly for about 30 minutes.  Return to the ED if bleeding persists after this or you develop any other concerns.

## 2017-08-27 NOTE — ED Provider Notes (Signed)
[facility] EMERGENCY DEPARTMENT Provider Note   CSN: [id number] Arrival date & time: 08/26/17  2237     History   Chief Complaint Chief Complaint  Patient presents with  . Dialysis cath bleeding    HPI Harold Freeman is a 47 y.o. male.  Patient presents with bleeding from his left forearm dialysis fistula since he ended dialysis last evening.  States he did notice some bruising when he got home from dialysis and it persisted so he decided to come to the hospital.  He does occasionally have bleeding but it usually resolves on its own.  He takes aspirin but no other blood thinners.  He has been on dialysis for the past 5 years due to polycystic kidney disease.  His dialysis Monday Wednesday Friday and denies any missed sessions.  No chest pain, shortness of breath, dizziness or lightheadedness.  The history is provided by the patient.    Past Medical History:  Diagnosis Date  . Anxiety   . Bronchitis    hx of  . CHF (congestive heart failure) (HCC)   . Coronary artery disease    by 07/04/12 cath: 40% LAD, 90% RCA; medical management  . Diabetes mellitus    type 2  . Dialysis patient (HCC)   . Diarrhea   . GERD (gastroesophageal reflux disease)   . Gout   . History of kidney stones   . Hyperlipidemia   . Hypertension   . Kidney stones   . Low iron   . Nonischemic cardiomyopathy (HCC)    EF 35-40% by 09/06/16 echo at [facility]  . Obesity   . Palpitations   . Pneumonia   . Polycystic kidney disease    ESRD w/ fistula; dialysis M/W/F  . Restless legs   . Sleep apnea    unable to use cpap  . Tobacco abuse     Patient Active Problem List   Diagnosis Date Noted  . Obstructive sleep apnea 07/31/2016  . Epistaxis 07/31/2016  . Diabetes (HCC) 01/13/2015  . Hyperlipidemia 01/13/2015  . Nonischemic cardiomyopathy (HCC) 01/13/2015  . Palpitations 01/13/2015  . Morbid obesity (HCC) 11/30/2011  . HTN (hypertension) 11/30/2011  . End stage renal disease (HCC)  04/16/2011    Past Surgical History:  Procedure Laterality Date  . ANKLE FRACTURE SURGERY     47 years old  . AV FISTULA PLACEMENT  05/18/2011   Procedure: ARTERIOVENOUS (AV) FISTULA CREATION;  Surgeon: Nada Libman, MD;  Location: [facility] OR;  Service: Vascular;  Laterality: Left;  Left wrist fistula; ultrasound guided.  Arbutus Leas     as a teenager  . CARDIAC CATHETERIZATION    . CHOLECYSTECTOMY N/A 12/20/2016   Procedure: LAPAROSCOPIC CHOLECYSTECTOMY;  Surgeon: Berna Bue, MD;  Location: [facility] OR;  Service: General;  Laterality: N/A;  . KNEE ARTHROSCOPY W/ ACL RECONSTRUCTION    . LEFT HEART CATHETERIZATION WITH CORONARY ANGIOGRAM N/A 07/04/2012   Procedure: LEFT HEART CATHETERIZATION WITH CORONARY ANGIOGRAM;  Surgeon: Dolores Patty, MD;  Location: [facility] CATH LAB;  Service: Cardiovascular;  Laterality: N/A;        Home Medications    Prior to Admission medications   Medication Sig Start Date End Date Taking? Authorizing Provider  allopurinol (ZYLOPRIM) 300 MG tablet Take 300 mg by mouth every evening.     [provider]  aspirin 81 MG tablet Take 81 mg by mouth daily.    [provider]  buPROPion (WELLBUTRIN XL) 150 MG 24 hr tablet Take  150 mg by mouth every evening.     [provider]  calcium acetate (PHOSLO) 667 MG capsule Take 2,001 mg by mouth 3 (three) times daily with meals.  06/08/11   [provider]  carvedilol (COREG) 12.5 MG tablet TAKE 25 MG TWICE DAILY EXCEPT ON DIALYSIS DAYS (M-W-F) AND TAKES 12.5 MG IN THE MORNING AND 25 MG IN THE EVENINGS 09/26/16   [provider]  carvedilol (COREG) 6.25 MG tablet M, W, F take 2 tablets and all other days take 3 tablets    [provider]  Ferric Citrate (AURYXIA) 1 GM 210 MG(Fe) TABS Take 420 mg by mouth 3 (three) times daily.     [provider]  glimepiride (AMARYL) 4 MG tablet Take 4 mg by mouth daily before breakfast.    [provider]    HYDROcodone-acetaminophen (NORCO/VICODIN) 5-325 MG tablet Take 1-2 tablets by mouth every 6 (six) hours as needed for severe pain. 10/25/16   Gwyneth Sprout, MD  ibuprofen (ADVIL,MOTRIN) 200 MG tablet Take 400 mg by mouth every 6 (six) hours as needed for headache or mild pain.    [provider]  metroNIDAZOLE (FLAGYL) 500 MG tablet Take 1 tablet (500 mg total) by mouth 2 (two) times daily. 07/03/17   Long, Arlyss Repress, MD  multivitamin (RENA-VIT) TABS tablet Take 1 tablet by mouth every evening.     [provider]  omeprazole (PRILOSEC) 20 MG capsule Take 20 mg by mouth daily.    [provider]  oxyCODONE-acetaminophen (PERCOCET/ROXICET) 5-325 MG tablet Take 1 tablet by mouth every 6 (six) hours as needed for severe pain. 12/20/16   Berna Bue, MD  rOPINIRole (REQUIP) 0.25 MG tablet Take 0.25 mg by mouth at bedtime. 11/21/15   [provider]  sulfamethoxazole-trimethoprim (BACTRIM DS,SEPTRA DS) 800-160 MG tablet Take 1 tablet by mouth 2 (two) times daily. 04/02/17   [provider]  traZODone (DESYREL) 50 MG tablet TAKE 1 TABLET BY MOUTH AT BEDTIME 10/05/16   [provider]    Family History Family History  Problem Relation Age of Onset  . Diabetes Mother   . Hypertension Mother   . Kidney disease Mother        Polycystic kidney disease  . Cancer Father   . Diabetes Father   . Hypertension Father   . Diabetes Sister     Social History Social History   Tobacco Use  . Smoking status: Former Smoker    Packs/day: 1.00    Years: 26.00    Pack years: 26.00    Types: Cigarettes    Last attempt to quit: 06/23/2015    Years since quitting: 2.1  . Smokeless tobacco: Never Used  Substance Use Topics  . Alcohol use: Yes    Comment: rare  . Drug use: No     Allergies   Mushroom extract complex   Review of Systems Review of Systems  Constitutional: Negative for activity change, appetite change and fever.  HENT:  Negative for congestion.   Respiratory: Negative for cough, chest tightness and shortness of breath.   Cardiovascular: Negative for chest pain.  Gastrointestinal: Negative for abdominal pain, nausea and vomiting.  Genitourinary: Negative for testicular pain and urgency.  Musculoskeletal: Negative for arthralgias and myalgias.  Skin: Positive for wound.  Neurological: Negative for dizziness, weakness, light-headedness and headaches.    all other systems are negative except as noted in the HPI and PMH.    Physical Exam Updated Vital Signs  BP (!) 168/100 (BP Location: Right Arm)   Pulse 83   Temp 98.2 F (36.8 C) (Oral)   Resp 18   Ht 6\' 1"  (1.854 m)   Wt 121.6 kg (268 lb)   SpO2 98%   BMI 35.36 kg/m   Physical Exam  Constitutional: He is oriented to person, place, and time. He appears well-developed and well-nourished. No distress.  HENT:  Head: Normocephalic and atraumatic.  Mouth/Throat: Oropharynx is clear and moist. No oropharyngeal exudate.  Eyes: Pupils are equal, round, and reactive to light. Conjunctivae and EOM are normal.  Neck: Normal range of motion. Neck supple.  No meningismus.  Cardiovascular: Normal rate, regular rhythm, normal heart sounds and intact distal pulses.  No murmur heard. Pulmonary/Chest: Effort normal and breath sounds normal. No respiratory distress.  Abdominal: Soft. There is no tenderness. There is no rebound and no guarding.  Musculoskeletal: Normal range of motion. He exhibits no edema or tenderness.  Left forearm fistula with thrill present.  Pressure dressing is removed and there is no active bleeding.  Intact radial pulse, intact cardinal hand movements  Neurological: He is alert and oriented to person, place, and time. No cranial nerve deficit. He exhibits normal muscle tone. Coordination normal.  No ataxia on finger to nose bilaterally. No pronator drift. 5/5 strength throughout. CN 2-12 intact.Equal grip strength. Sensation intact.    Skin: Skin is warm.  Psychiatric: He has a normal mood and affect. His behavior is normal.  Nursing note and vitals reviewed.    ED Treatments / Results  Labs (all labs ordered are listed, but only abnormal results are displayed) Labs Reviewed  CBC WITH DIFFERENTIAL/PLATELET - Abnormal; Notable for the following components:      Result Value   RBC 3.73 (*)    Hemoglobin 12.8 (*)    HCT 36.1 (*)    MCH 34.3 (*)    All other components within normal limits    EKG None  Radiology No results found.  Procedures Procedures (including critical care time)  Medications Ordered in ED Medications - No data to display   Initial Impression / Assessment and Plan / ED Course  I have reviewed the triage vital signs and the nursing notes.  Pertinent labs & imaging results that were available during my care of the patient were reviewed by me and considered in my medical decision making (see chart for details).    Bleeding from dialysis fistula, now controlled.  Neurovascular intact.  Thrill present.  Hemoglobin stable.  Patient observed in the ED with no further bleeding from his fistula site.  Patient given dressing.  Return precautions discussed.  Final Clinical Impressions(s) / ED Diagnoses   Final diagnoses:  Bleeding from dialysis shunt, initial encounter [facility])    ED Discharge Orders    None       Mylin Gignac, Jeannett Senior, MD 08/27/17 [phone]

## 2017-08-27 NOTE — ED Notes (Signed)
Dialysis cath site still not bleeding

## 2017-10-15 DIAGNOSIS — Z992 Dependence on renal dialysis: Secondary | ICD-10-CM | POA: Diagnosis not present

## 2017-10-15 DIAGNOSIS — Z87891 Personal history of nicotine dependence: Secondary | ICD-10-CM | POA: Insufficient documentation

## 2017-10-15 DIAGNOSIS — E1122 Type 2 diabetes mellitus with diabetic chronic kidney disease: Secondary | ICD-10-CM | POA: Insufficient documentation

## 2017-10-15 DIAGNOSIS — I509 Heart failure, unspecified: Secondary | ICD-10-CM | POA: Diagnosis not present

## 2017-10-15 DIAGNOSIS — R040 Epistaxis: Secondary | ICD-10-CM | POA: Diagnosis present

## 2017-10-15 DIAGNOSIS — Z79899 Other long term (current) drug therapy: Secondary | ICD-10-CM | POA: Diagnosis not present

## 2017-10-15 DIAGNOSIS — I132 Hypertensive heart and chronic kidney disease with heart failure and with stage 5 chronic kidney disease, or end stage renal disease: Secondary | ICD-10-CM | POA: Diagnosis not present

## 2017-10-15 DIAGNOSIS — N186 End stage renal disease: Secondary | ICD-10-CM | POA: Insufficient documentation

## 2017-10-16 ENCOUNTER — Emergency Department (HOSPITAL_BASED_OUTPATIENT_CLINIC_OR_DEPARTMENT_OTHER)
Admission: EM | Admit: 2017-10-16 | Discharge: 2017-10-16 | Disposition: A | Discharge status: Home / Self Care | Payer: Medicare Other | Attending: Emergency Medicine | Admitting: Emergency Medicine

## 2017-10-16 ENCOUNTER — Other Ambulatory Visit: Payer: Self-pay

## 2017-10-16 ENCOUNTER — Encounter (HOSPITAL_BASED_OUTPATIENT_CLINIC_OR_DEPARTMENT_OTHER): Payer: Self-pay | Admitting: Emergency Medicine

## 2017-10-16 DIAGNOSIS — R040 Epistaxis: Secondary | ICD-10-CM | POA: Diagnosis not present

## 2017-10-16 DIAGNOSIS — Z992 Dependence on renal dialysis: Secondary | ICD-10-CM

## 2017-10-16 DIAGNOSIS — N186 End stage renal disease: Secondary | ICD-10-CM

## 2017-10-16 MED ORDER — OXYCODONE-ACETAMINOPHEN 5-325 MG PO TABS
1.0000 | ORAL_TABLET | Freq: Once | ORAL | Status: AC
  Administered 2017-10-16: 1 via ORAL
  Filled 2017-10-16: qty 1

## 2017-10-16 MED ORDER — OXYMETAZOLINE HCL 0.05 % NA SOLN
NASAL | Status: AC
  Filled 2017-10-16: qty 15

## 2017-10-16 MED ORDER — COCAINE HCL 4 % EX SOLN
4.0000 mL | Freq: Once | CUTANEOUS | Status: DC
  Filled 2017-10-16: qty 4

## 2017-10-16 NOTE — ED Notes (Signed)
PT sitting on side of bed. Nose clamp removed from by patient. Pt states has used a nose bleed kit at home. Still bleeding.

## 2017-10-16 NOTE — Discharge Instructions (Addendum)
Although your nose is not bleeding right now, it is very common for bleeding to start again.  Please see the ENT specialist today.  If bleeding starts up again, you are welcome to return to the emergency department.

## 2017-10-16 NOTE — ED Triage Notes (Addendum)
Pt states he blew his nose tonight around 2215 and it started to bleed and has not been able to get it to stop  Pt gets heparin every other day with his dialysis and takes a baby aspirin at night

## 2017-10-16 NOTE — ED Notes (Signed)
Nose clamp applied

## 2017-10-16 NOTE — ED Provider Notes (Signed)
[facility] EMERGENCY DEPARTMENT Provider Note   CSN: [id number] Arrival date & time: 10/15/17  2358     History   Chief Complaint Chief Complaint  Patient presents with  . Epistaxis    HPI Harold Freeman is a 47 y.o. male.  The history is provided by the patient.  He has history of diabetes, hypertension, hyperlipidemia, coronary artery disease, congestive heart failure, end-stage renal disease on hemodialysis and comes in with right-sided epistaxis which started tonight.  He has not had prior problems with nosebleeds.  He takes aspirin but is on no systemic anticoagulants and no other antiplatelet agents.  He denies any trauma.  Past Medical History:  Diagnosis Date  . Anxiety   . Bronchitis    hx of  . CHF (congestive heart failure) (HCC)   . Coronary artery disease    by 07/04/12 cath: 40% LAD, 90% RCA; medical management  . Diabetes mellitus    type 2  . Dialysis patient (HCC)   . Diarrhea   . GERD (gastroesophageal reflux disease)   . Gout   . History of kidney stones   . Hyperlipidemia   . Hypertension   . Kidney stones   . Low iron   . Nonischemic cardiomyopathy (HCC)    EF 35-40% by 09/06/16 echo at [facility]  . Obesity   . Palpitations   . Pneumonia   . Polycystic kidney disease    ESRD w/ fistula; dialysis M/W/F  . Restless legs   . Sleep apnea    unable to use cpap  . Tobacco abuse     Patient Active Problem List   Diagnosis Date Noted  . Obstructive sleep apnea 07/31/2016  . Epistaxis 07/31/2016  . Diabetes (HCC) 01/13/2015  . Hyperlipidemia 01/13/2015  . Nonischemic cardiomyopathy (HCC) 01/13/2015  . Palpitations 01/13/2015  . Morbid obesity (HCC) 11/30/2011  . HTN (hypertension) 11/30/2011  . End stage renal disease (HCC) 04/16/2011    Past Surgical History:  Procedure Laterality Date  . ANKLE FRACTURE SURGERY     47 years old  . AV FISTULA PLACEMENT  05/18/2011   Procedure: ARTERIOVENOUS (AV) FISTULA CREATION;  Surgeon: Nada Libman, MD;  Location: [facility] OR;  Service: Vascular;  Laterality: Left;  Left wrist fistula; ultrasound guided.  Arbutus Leas     as a teenager  . CARDIAC CATHETERIZATION    . CHOLECYSTECTOMY N/A 12/20/2016   Procedure: LAPAROSCOPIC CHOLECYSTECTOMY;  Surgeon: Berna Bue, MD;  Location: [facility] OR;  Service: General;  Laterality: N/A;  . KNEE ARTHROSCOPY W/ ACL RECONSTRUCTION    . LEFT HEART CATHETERIZATION WITH CORONARY ANGIOGRAM N/A 07/04/2012   Procedure: LEFT HEART CATHETERIZATION WITH CORONARY ANGIOGRAM;  Surgeon: Dolores Patty, MD;  Location: [facility] CATH LAB;  Service: Cardiovascular;  Laterality: N/A;        Home Medications    Prior to Admission medications   Medication Sig Start Date End Date Taking? Authorizing Provider  allopurinol (ZYLOPRIM) 300 MG tablet Take 300 mg by mouth every evening.     [provider]  aspirin 81 MG tablet Take 81 mg by mouth daily.    [provider]  buPROPion (WELLBUTRIN XL) 150 MG 24 hr tablet Take 150 mg by mouth every evening.     [provider]  calcium acetate (PHOSLO) 667 MG capsule Take 2,001 mg by mouth 3 (three) times daily with meals.  06/08/11   [provider]  carvedilol (COREG) 12.5 MG tablet TAKE 25 MG  TWICE DAILY EXCEPT ON DIALYSIS DAYS (M-W-F) AND TAKES 12.5 MG IN THE MORNING AND 25 MG IN THE EVENINGS 09/26/16   [provider]  carvedilol (COREG) 6.25 MG tablet M, W, F take 2 tablets and all other days take 3 tablets    [provider]  Ferric Citrate (AURYXIA) 1 GM 210 MG(Fe) TABS Take 420 mg by mouth 3 (three) times daily.     [provider]  glimepiride (AMARYL) 4 MG tablet Take 4 mg by mouth daily before breakfast.    [provider]  HYDROcodone-acetaminophen (NORCO/VICODIN) 5-325 MG tablet Take 1-2 tablets by mouth every 6 (six) hours as needed for severe pain. 10/25/16   Gwyneth Sprout, MD  ibuprofen (ADVIL,MOTRIN) 200 MG tablet Take 400 mg by mouth  every 6 (six) hours as needed for headache or mild pain.    [provider]  metroNIDAZOLE (FLAGYL) 500 MG tablet Take 1 tablet (500 mg total) by mouth 2 (two) times daily. 07/03/17   Long, Arlyss Repress, MD  multivitamin (RENA-VIT) TABS tablet Take 1 tablet by mouth every evening.     [provider]  omeprazole (PRILOSEC) 20 MG capsule Take 20 mg by mouth daily.    [provider]  oxyCODONE-acetaminophen (PERCOCET/ROXICET) 5-325 MG tablet Take 1 tablet by mouth every 6 (six) hours as needed for severe pain. 12/20/16   Berna Bue, MD  rOPINIRole (REQUIP) 0.25 MG tablet Take 0.25 mg by mouth at bedtime. 11/21/15   [provider]  sulfamethoxazole-trimethoprim (BACTRIM DS,SEPTRA DS) 800-160 MG tablet Take 1 tablet by mouth 2 (two) times daily. 04/02/17   [provider]  traZODone (DESYREL) 50 MG tablet TAKE 1 TABLET BY MOUTH AT BEDTIME 10/05/16   [provider]    Family History Family History  Problem Relation Age of Onset  . Diabetes Mother   . Hypertension Mother   . Kidney disease Mother        Polycystic kidney disease  . Cancer Father   . Diabetes Father   . Hypertension Father   . Diabetes Sister     Social History Social History   Tobacco Use  . Smoking status: Former Smoker    Packs/day: 1.00    Years: 26.00    Pack years: 26.00    Types: Cigarettes    Last attempt to quit: 06/23/2015    Years since quitting: 2.3  . Smokeless tobacco: Never Used  Substance Use Topics  . Alcohol use: Yes    Comment: rare  . Drug use: No     Allergies   Mushroom extract complex   Review of Systems Review of Systems  All other systems reviewed and are negative.    Physical Exam Updated Vital Signs BP (!) 150/98 (BP Location: Right Arm)   Pulse 88   Temp 98.3 F (36.8 C) (Oral)   Resp 20   Ht 6\' 1"  (1.854 m)   Wt 120.2 kg   SpO2 100%   BMI 34.96 kg/m   Physical Exam  Nursing note and vitals reviewed.  47  year old male, resting comfortably and in no acute distress. Vital signs are significant for elevated blood pressure. Oxygen saturation is 100%, which is normal. Head is normocephalic and atraumatic. PERRLA, EOMI. Oropharynx is clear.  Bleeding site identified on right side of nasal septum in Kiesselbach's plexus. Neck is nontender and supple without adenopathy or JVD. Back is nontender and there is no CVA tenderness. Lungs are clear without rales, wheezes,  or rhonchi. Chest is nontender. Heart has regular rate and rhythm without murmur. Abdomen is soft, flat, nontender without masses or hepatosplenomegaly and peristalsis is normoactive. Extremities have no cyanosis or edema, full range of motion is present.  AV fistula present left forearm with thrill present. Skin is warm and dry without rash. Neurologic: Mental status is normal, cranial nerves are intact, there are no motor or sensory deficits.  ED Treatments / Results   Procedures .Epistaxis Management Date/Time: 10/16/2017 2:00 AM Performed by: Dione Booze, MD Authorized by: Dione Booze, MD   Consent:    Consent obtained:  Verbal   Consent given by:  Patient   Risks discussed:  Bleeding and pain   Alternatives discussed:  Alternative treatment Anesthesia (see MAR for exact dosages):    Anesthesia method:  None Procedure details:    Treatment site:  R anterior   Treatment method:  Anterior pack (4.5 cm Rapid Rhino)   Treatment complexity:  Limited   Treatment episode: initial   Post-procedure details:    Assessment:  Bleeding stopped   Patient tolerance of procedure:  Procedure terminated at patient's request Comments:     He had good control of bleeding and was observed in the ED for 1 hour.  However, he stated that it was too painful to have the Rapid Rhino in place in spite of being given a dose of oxycodone-acetaminophen.  He insisted on having it removed.  On removal bleeding site appeared to be dry.  I recommended  cautery, but patient refused.  Risk of resumption of bleeding was explained to patient and he expressed understanding.  He stated he wished to see an ENT physician.  He was referred to the on-call ENT.     Medications Ordered in ED Medications  oxymetazoline (AFRIN) 0.05 % nasal spray (has no administration in time range)  cocaine 4 % topical solution 4 mL (4 mLs Nasal Refused 10/16/17 0310)  oxyCODONE-acetaminophen (PERCOCET/ROXICET) 5-325 MG per tablet 1 tablet (1 tablet Oral Given 10/16/17 0259)     Initial Impression / Assessment and Plan / ED Course  I have reviewed the triage vital signs and the nursing notes.  Epistaxis treated with a Rapid Rhino in the ED.  Old records are reviewed, and he has no relevant past visits.  Bleeding was controlled, but patient unable to tolerate the Rapid Rhino, refusing cautery.  He is referred to ENT for further evaluation.  Final Clinical Impressions(s) / ED Diagnoses   Final diagnoses:  Anterior epistaxis  End-stage renal disease on hemodialysis [facility])    ED Discharge Orders    None       Dione Booze, MD 10/16/17 [phone]

## 2017-10-16 NOTE — ED Notes (Signed)
Pt complaining that his nose is still bleeding, pt continues to take the clamp off nose. Sprayed afrin, but pt may not have gotten enough of a dose as he refused to lean back and would not inhale the medication. Clamp reapplied to nose and pt given ice pack to hold on the bridge of nose.

## 2018-03-27 ENCOUNTER — Ambulatory Visit (INDEPENDENT_AMBULATORY_CARE_PROVIDER_SITE_OTHER): Payer: Medicare Other | Admitting: Podiatry

## 2018-03-27 ENCOUNTER — Encounter: Payer: Self-pay | Admitting: Podiatry

## 2018-03-27 DIAGNOSIS — E1149 Type 2 diabetes mellitus with other diabetic neurological complication: Secondary | ICD-10-CM

## 2018-03-27 DIAGNOSIS — M2042 Other hammer toe(s) (acquired), left foot: Secondary | ICD-10-CM | POA: Diagnosis not present

## 2018-03-27 DIAGNOSIS — E1140 Type 2 diabetes mellitus with diabetic neuropathy, unspecified: Secondary | ICD-10-CM

## 2018-03-27 DIAGNOSIS — L84 Corns and callosities: Secondary | ICD-10-CM | POA: Diagnosis not present

## 2018-03-30 NOTE — Progress Notes (Signed)
Subjective:   Patient ID: Harold Freeman, male   DOB: 48 y.o.   MRN: [id number]   HPI Patient presents stating that he did have a lot of surgery was off his foot and has lesions on both his feet that are difficult for him to take care of himself and they worsened recently   ROS      Objective:  Physical Exam  Neurovascular status unchanged from previous visit with patient having had some significant health problems and did have a kidney transplant.  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  This will need to be done routinely

## 2018-06-26 ENCOUNTER — Encounter: Payer: Self-pay | Admitting: Podiatry

## 2018-06-26 ENCOUNTER — Ambulatory Visit (INDEPENDENT_AMBULATORY_CARE_PROVIDER_SITE_OTHER): Payer: Medicare Other | Admitting: Podiatry

## 2018-06-26 ENCOUNTER — Other Ambulatory Visit: Payer: Self-pay

## 2018-06-26 VITALS — Temp 97.5°F

## 2018-06-26 DIAGNOSIS — M2042 Other hammer toe(s) (acquired), left foot: Secondary | ICD-10-CM

## 2018-06-26 DIAGNOSIS — E1140 Type 2 diabetes mellitus with diabetic neuropathy, unspecified: Secondary | ICD-10-CM | POA: Diagnosis not present

## 2018-06-26 DIAGNOSIS — E1149 Type 2 diabetes mellitus with other diabetic neurological complication: Secondary | ICD-10-CM

## 2018-06-26 DIAGNOSIS — L84 Corns and callosities: Secondary | ICD-10-CM | POA: Diagnosis not present

## 2018-06-27 NOTE — Progress Notes (Signed)
Subjective:   Patient ID: Harold Freeman, male   DOB: 48 y.o.   MRN: [id number]   HPI Long-term diabetic who has had a kidney transplant who has severe lesions on both feet which become painful   ROS      Objective:  Physical Exam  Neurovascular status unchanged with thick keratotic lesion formation bilateral painful when palpated     Assessment:  Chronic lesion formation bilateral     Plan:  Debridement of painful lesions bilateral with no iatrogenic bleeding noted and reappoint for routine care

## 2018-09-30 ENCOUNTER — Ambulatory Visit: Payer: BC Managed Care – PPO | Admitting: Podiatry

## 2018-10-01 ENCOUNTER — Ambulatory Visit: Payer: BC Managed Care – PPO | Admitting: Podiatry

## 2018-12-02 ENCOUNTER — Ambulatory Visit: Payer: Medicare Other | Admitting: Podiatry

## 2019-03-18 ENCOUNTER — Other Ambulatory Visit: Payer: Self-pay | Admitting: Nephrology

## 2019-03-19 ENCOUNTER — Other Ambulatory Visit: Payer: Self-pay | Admitting: Nephrology

## 2019-03-19 DIAGNOSIS — Z940 Kidney transplant status: Secondary | ICD-10-CM

## 2019-03-26 ENCOUNTER — Other Ambulatory Visit: Payer: Self-pay

## 2019-03-26 ENCOUNTER — Other Ambulatory Visit: Payer: Self-pay | Admitting: Nephrology

## 2019-03-26 ENCOUNTER — Ambulatory Visit
Admission: RE | Admit: 2019-03-26 | Discharge: 2019-03-26 | Disposition: A | Discharge status: Home / Self Care | Payer: Medicare Other | Source: Ambulatory Visit | Attending: Nephrology | Admitting: Nephrology

## 2019-03-26 DIAGNOSIS — Z940 Kidney transplant status: Secondary | ICD-10-CM

## 2020-01-19 ENCOUNTER — Encounter: Payer: Self-pay | Admitting: Endocrinology

## 2020-03-18 ENCOUNTER — Ambulatory Visit: Payer: Medicare Other | Admitting: Endocrinology

## 2020-05-20 ENCOUNTER — Ambulatory Visit: Payer: Medicare Other | Admitting: Endocrinology

## 2020-08-15 ENCOUNTER — Encounter: Payer: Self-pay | Admitting: Endocrinology

## 2021-05-18 ENCOUNTER — Encounter: Payer: Self-pay | Admitting: Gastroenterology

## 2021-06-16 ENCOUNTER — Ambulatory Visit: Payer: Self-pay | Admitting: Gastroenterology

## 2021-08-24 ENCOUNTER — Ambulatory Visit (INDEPENDENT_AMBULATORY_CARE_PROVIDER_SITE_OTHER): Payer: Commercial Managed Care - PPO | Admitting: Pulmonary Disease

## 2021-08-24 ENCOUNTER — Encounter: Payer: Self-pay | Admitting: Pulmonary Disease

## 2021-08-24 VITALS — BP 118/68 | HR 75 | Temp 97.6°F | Ht 73.0 in | Wt 271.4 lb

## 2021-08-24 DIAGNOSIS — G4733 Obstructive sleep apnea (adult) (pediatric): Secondary | ICD-10-CM | POA: Diagnosis not present

## 2021-08-24 NOTE — Progress Notes (Signed)
HST   Subjective:    Patient ID: Harold Freeman, male    DOB: [birth year], 51 y.o.   MRN: [id number]  HPI  Chief Complaint  Patient presents with   Consult    Has OSA. Tried CPAP and it did not work well. Had  sleep study here 5+ years ago. Needs alternative to CPAP.    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 He would especially like to discuss options other than CPAP  PMH -renal transplant 2019 Diabetes Hypertension HFrEF-EF 30 to 40%  OSA was diagnosed in 2018 with a home sleep test that showed moderate OSA with desaturation to 75%.  He did not tolerate CPAP, fullface mask and stopped using within a few weeks.  His weight is mostly unchanged since then. His wife reports loud snoring and gasping episodes of sleep "like he is drowning" especially when he lies on his back.  Bedtime is 9 PM, he starts sleeping on his side with 1 pillow but also on his back, reports 2-3 nocturnal awakenings until he is out of bed around 5 AM feeling tired without dryness of mouth or headaches. There is no history suggestive of cataplexy, sleep paralysis or parasomnias Accompanied by his wife who corroborates history  He smoked about 20 pack years before he quit in 2019. He works in document control   Significant tests/ events reviewed  HST 08/09/16-AHI 20.6/hour, desaturation to 75%, weight 269 pounds   Past Medical History:  Diagnosis Date   Anxiety    Bronchitis    hx of   CHF (congestive heart failure) (HCC)    Coronary artery disease    by 07/04/12 cath: 40% LAD, 90% RCA; medical management   Diabetes mellitus    type 2   Dialysis patient [facility])    Diarrhea    GERD (gastroesophageal reflux disease)    Gout    History of kidney stones    Hyperlipidemia    Hypertension    Kidney stones    Low iron    Nonischemic cardiomyopathy (HCC)    EF 35-40% by 09/06/16 echo at [facility]   Obesity    Palpitations    Pneumonia    Polycystic kidney disease    ESRD  w/ fistula; dialysis M/W/F   Restless legs    Sleep apnea    unable to use cpap   Tobacco abuse    Past Surgical History:  Procedure Laterality Date   ANKLE FRACTURE SURGERY     51 years old   AV FISTULA PLACEMENT  05/18/2011   Procedure: ARTERIOVENOUS (AV) FISTULA CREATION;  Surgeon: Nada Libman, MD;  Location: [facility] OR;  Service: Vascular;  Laterality: Left;  Left wrist fistula; ultrasound guided.   BUNIONECTOMY     as a teenager   CARDIAC CATHETERIZATION     CHOLECYSTECTOMY N/A 12/20/2016   Procedure: LAPAROSCOPIC CHOLECYSTECTOMY;  Surgeon: Berna Bue, MD;  Location: [facility] OR;  Service: General;  Laterality: N/A;   KNEE ARTHROSCOPY W/ ACL RECONSTRUCTION     LEFT HEART CATHETERIZATION WITH CORONARY ANGIOGRAM N/A 07/04/2012   Procedure: LEFT HEART CATHETERIZATION WITH CORONARY ANGIOGRAM;  Surgeon: Dolores Patty, MD;  Location: [facility] CATH LAB;  Service: Cardiovascular;  Laterality: N/A;    Allergies  Allergen Reactions   Mushroom Extract Complex Nausea Only and Other (See Comments)    STOMACH PAIN    Social History   Socioeconomic History   Marital status: Single    Spouse name: Not on file  Number of children: Not on file   Years of education: Not on file   Highest education level: Not on file  Occupational History   Not on file  Tobacco Use   Smoking status: Former    Packs/day: 1.00    Years: 26.00    Total pack years: 26.00    Types: Cigarettes    Quit date: 06/23/2015    Years since quitting: 6.1   Smokeless tobacco: Never  Vaping Use   Vaping Use: Never used  Substance and Sexual Activity   Alcohol use: Yes    Comment: rare   Drug use: No   Sexual activity: Not on file  Other Topics Concern   Not on file  Social History Narrative   Not on file   Social Determinants of Health   Financial Resource Strain: Not on file  Food Insecurity: Not on file  Transportation Needs: Not on file  Physical Activity: Not on file  Stress: Not on file  Social  Connections: Not on file  Intimate Partner Violence: Not on file    Family History  Problem Relation Age of Onset   Diabetes Mother    Hypertension Mother    Kidney disease Mother        Polycystic kidney disease   Cancer Father    Diabetes Father    Hypertension Father    Diabetes Sister      Review of Systems Constitutional: negative for anorexia, fevers and sweats  Eyes: negative for irritation, redness and visual disturbance  Ears, nose, mouth, throat, and face: negative for earaches, epistaxis, nasal congestion and sore throat  Respiratory: negative for cough, dyspnea on exertion, sputum and wheezing  Cardiovascular: negative for chest pain, dyspnea, lower extremity edema, orthopnea, palpitations and syncope  Gastrointestinal: negative for abdominal pain, constipation, diarrhea, melena, nausea and vomiting  Genitourinary:negative for dysuria, frequency and hematuria  Hematologic/lymphatic: negative for bleeding, easy bruising and lymphadenopathy  Musculoskeletal:negative for arthralgias, muscle weakness and stiff joints  Neurological: negative for coordination problems, gait problems, headaches and weakness  Endocrine: negative for diabetic symptoms including polydipsia, polyuria and weight loss     Objective:   Physical Exam  Gen. Pleasant, obese, in no distress, normal affect ENT - no pallor,icterus, no post nasal drip, class 2airway, 2 mm underbite Neck: No JVD, no thyromegaly, no carotid bruits Lungs: no use of accessory muscles, no dullness to percussion, decreased without rales or rhonchi  Cardiovascular: Rhythm regular, heart sounds  normal, no murmurs or gallops, no peripheral edema Abdomen: soft and non-tender, no hepatosplenomegaly, BS normal. Musculoskeletal: No deformities, no cyanosis or clubbing Neuro:  alert, non focal, no tremors       Assessment & Plan:

## 2021-08-24 NOTE — Assessment & Plan Note (Signed)
We will reassess with a home sleep test to see if severity has changed.  His weight is unchanged over the past 5 years but he is undergone a renal transplant. Based on this we will discuss treatment options.  His wife will explore with his insurance whether dental appliance could be an option.  I do not think he will need hypoglossal nerve stimulator implant.  His best option might be to be to retrial CPAP with a formal CPAP titration study or mask desensitization prior to starting  The pathophysiology of obstructive sleep apnea , it's cardiovascular consequences & modes of treatment including CPAP were discused with the patient in detail & they evidenced understanding.

## 2021-08-24 NOTE — Patient Instructions (Signed)
X home sleep test   We discussed treatment options  Weight loss advised

## 2021-09-01 ENCOUNTER — Other Ambulatory Visit: Payer: Self-pay | Admitting: Nurse Practitioner

## 2021-09-01 DIAGNOSIS — R748 Abnormal levels of other serum enzymes: Secondary | ICD-10-CM

## 2021-10-06 IMAGING — CT CT ABD-PELV W/O CM
1 of 2 series · 14 of 32 positions shown, 19 images · non-contrast
Comparison: 07/03/2017

CLINICAL DATA: Renal transplant

EXAM:
CT ABDOMEN AND PELVIS WITHOUT CONTRAST
TECHNIQUE: Multidetector CT imaging of the abdomen and pelvis was performed
following the standard protocol without IV contrast. Oral enteric
contrast was administered.

[Series 2: abd/pelvis w/(date) · axial · 0.93mm/px · z∈[-414,+51]mm · 14 of 105 slices shown, 19 images]
[im 6/105  soft-tissue]
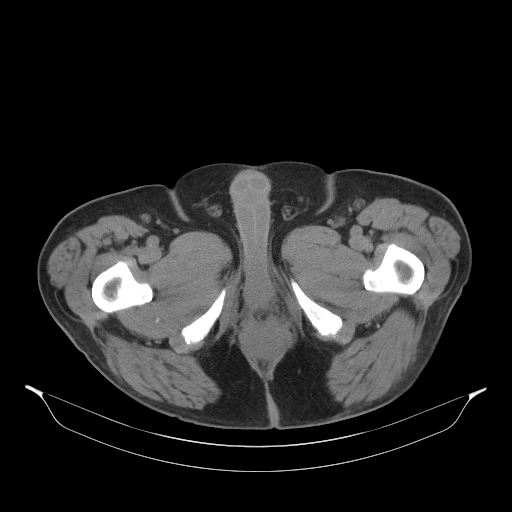
[im 6/105  bone]
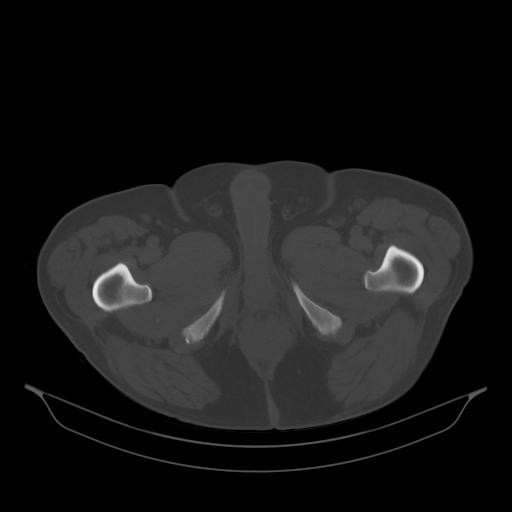
[im 17/105  soft-tissue]
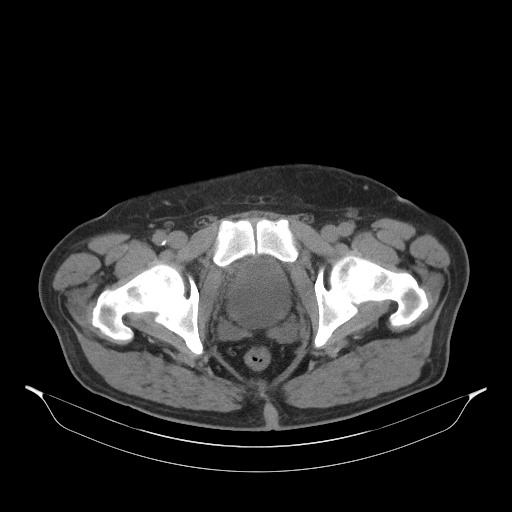
[im 22/105  soft-tissue]
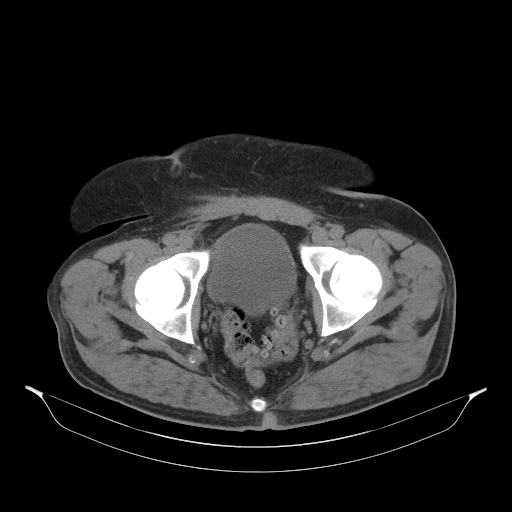
[im 28/105  soft-tissue]
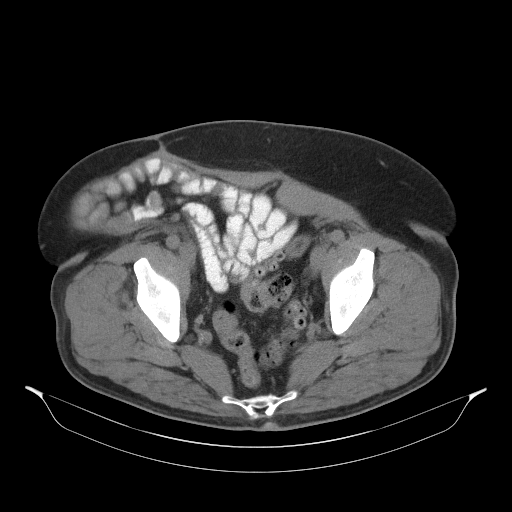
[im 39/105  soft-tissue]
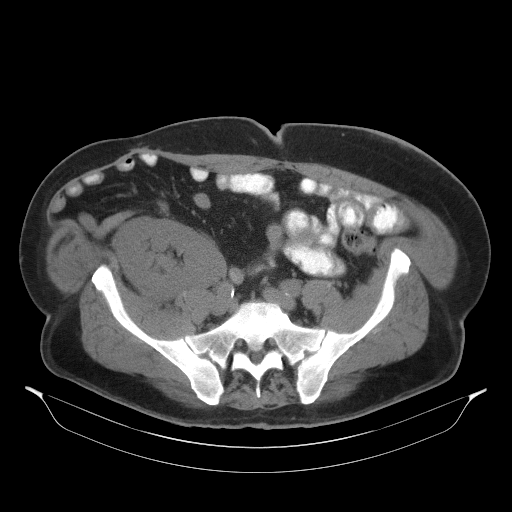
[im 44/105  soft-tissue]
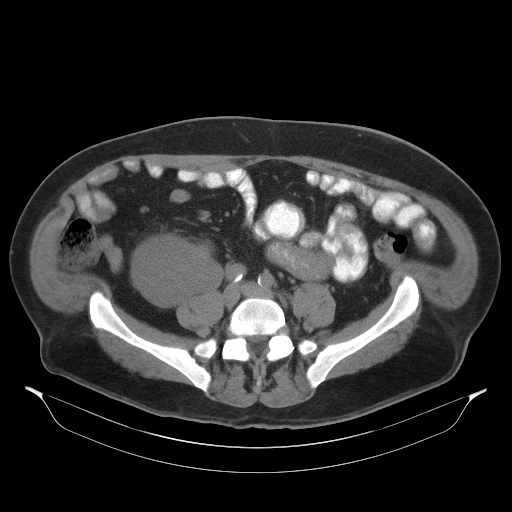
[im 55/105  soft-tissue]
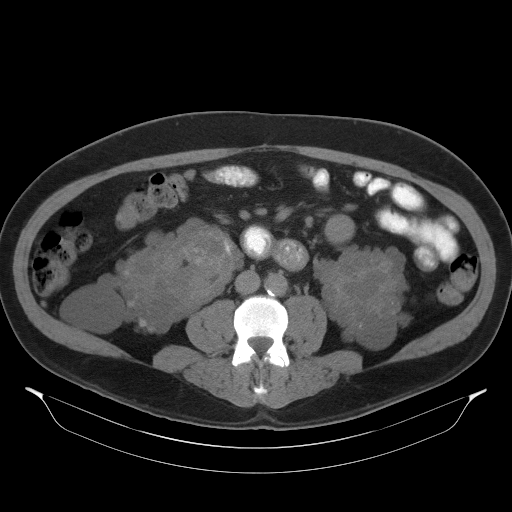
[im 61/105  soft-tissue]
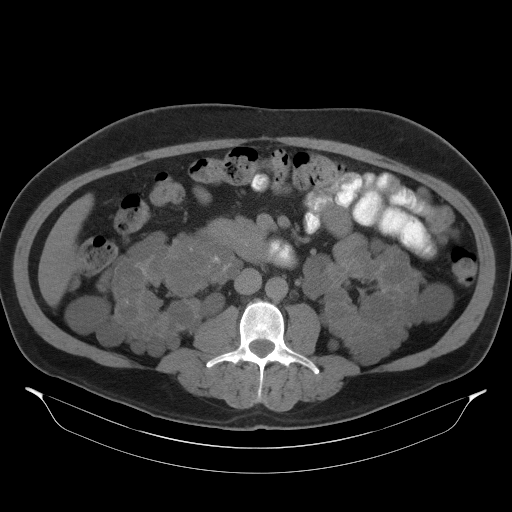
[im 66/105  soft-tissue]
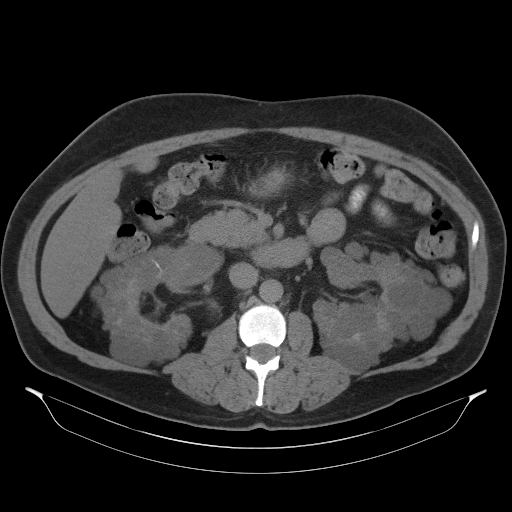
[im 66/105  bone]
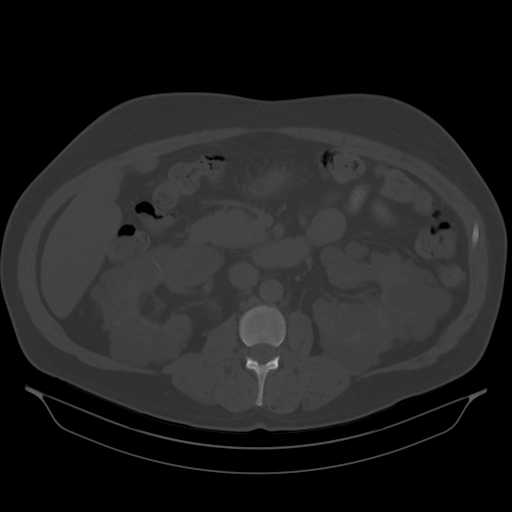
[im 77/105  soft-tissue]
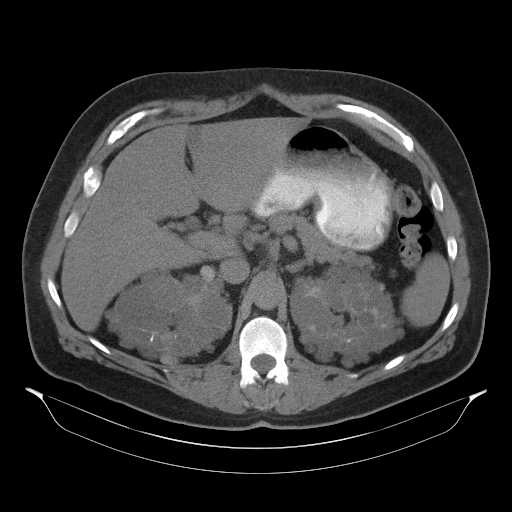
[im 83/105  soft-tissue]
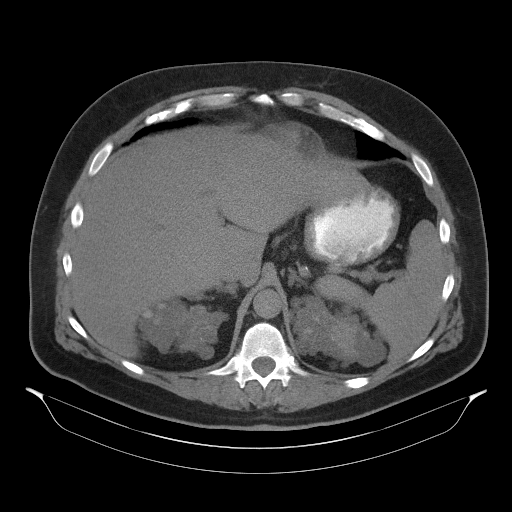
[im 83/105  lung]
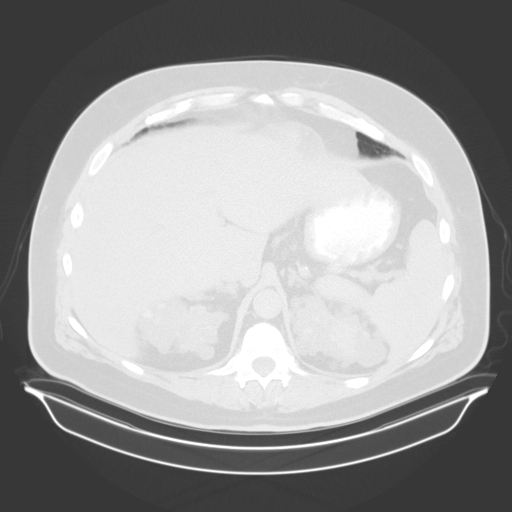
[im 88/105  soft-tissue]
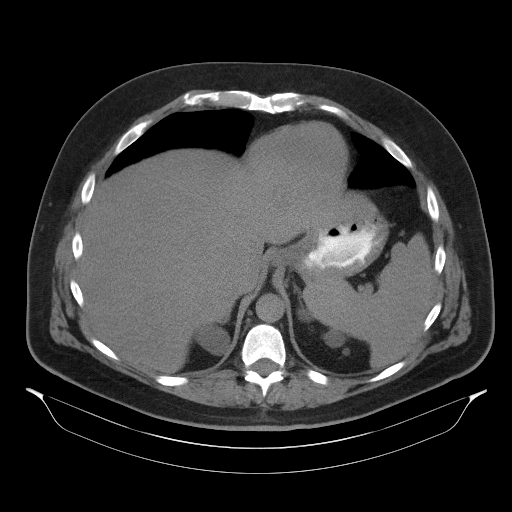
[im 88/105  lung]
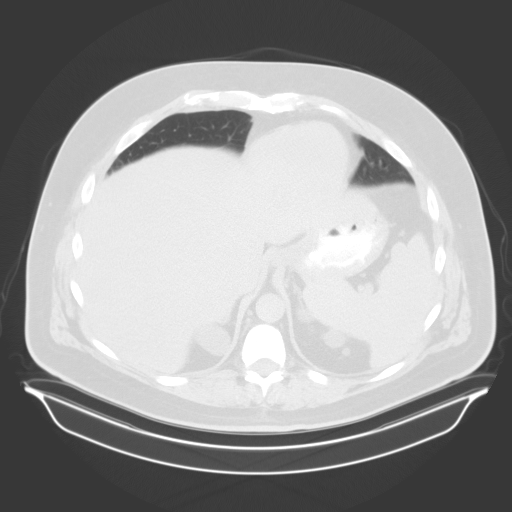
[im 94/105  lung]
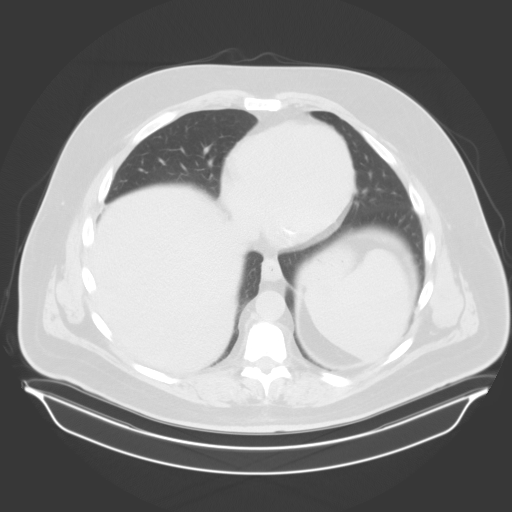
[im 99/105  soft-tissue]
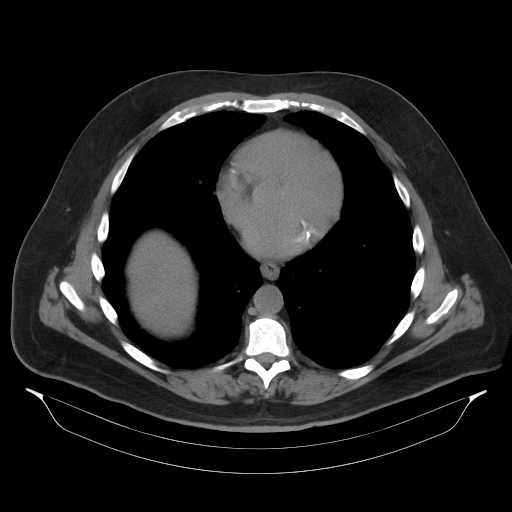
[im 99/105  lung]
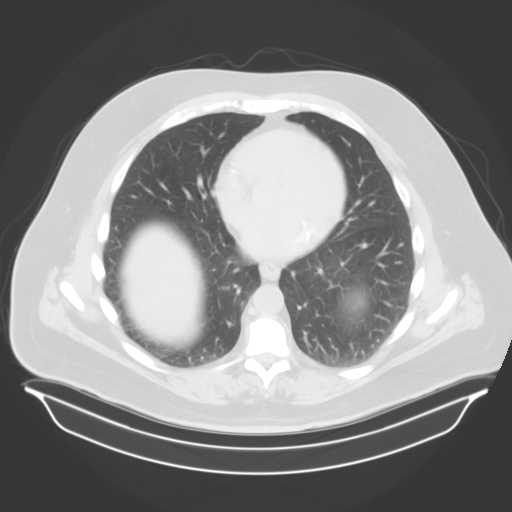

[14 of 32 positions shown; findings below may reference images not displayed]

FINDINGS: Lower chest: No acute abnormality. Extensive 3 vessel coronary
artery calcifications and/or stents.

Hepatobiliary: No focal liver abnormality is seen. Status post
cholecystectomy. No biliary dilatation.

Pancreas: Unremarkable. No pancreatic ductal dilatation or
surrounding inflammatory changes.

Spleen: Normal in size without significant abnormality.

Adrenals/Urinary Tract: Adrenal glands are unremarkable. Polycystic
kidneys. Right lower quadrant renal transplant allograft. No
hydronephrosis. Bladder is unremarkable.

Stomach/Bowel: Stomach is within normal limits. Appendix appears
normal. No evidence of bowel wall thickening, distention, or
inflammatory changes. Sigmoid diverticulosis.

Vascular/Lymphatic: Aortic atherosclerosis. No enlarged abdominal or
pelvic lymph nodes.

Reproductive: No mass or other significant abnormality.

Other: Broad-based ventral hernia of the right lower quadrant
containing nonobstructed loops of small bowel. No abdominopelvic
ascites.

Musculoskeletal: No acute or significant osseous findings.
IMPRESSION: 1. Polycystic kidneys. Right lower quadrant renal transplant
allograft. No hydronephrosis.
2. Broad-based ventral hernia of the right lower quadrant containing
nonobstructed loops of small bowel.
3. Sigmoid diverticulosis without evidence of acute diverticulitis.
4. Coronary artery disease. Aortic Atherosclerosis ([id number].F).

## 2021-10-13 ENCOUNTER — Ambulatory Visit
Admission: RE | Admit: 2021-10-13 | Discharge: 2021-10-13 | Disposition: A | Discharge status: Home / Self Care | Payer: Commercial Managed Care - PPO | Source: Ambulatory Visit | Attending: Nurse Practitioner | Admitting: Nurse Practitioner

## 2021-10-13 DIAGNOSIS — R748 Abnormal levels of other serum enzymes: Secondary | ICD-10-CM

## 2021-10-19 ENCOUNTER — Encounter: Payer: Self-pay | Admitting: Pulmonary Disease

## 2022-02-26 ENCOUNTER — Telehealth: Payer: Self-pay | Admitting: Pulmonary Disease

## 2022-02-26 NOTE — Telephone Encounter (Signed)
Per note on order Collette had tried to call pt on 9/28 and vm was full.  She then mailed the pt a letter to call.  I called just now and have left a vm for her to call me back.

## 2022-03-09 ENCOUNTER — Other Ambulatory Visit: Payer: Self-pay | Admitting: Nurse Practitioner

## 2022-03-09 ENCOUNTER — Telehealth: Payer: Self-pay | Admitting: Pulmonary Disease

## 2022-03-09 ENCOUNTER — Encounter (HOSPITAL_BASED_OUTPATIENT_CLINIC_OR_DEPARTMENT_OTHER): Payer: Commercial Managed Care - PPO

## 2022-03-09 DIAGNOSIS — K760 Fatty (change of) liver, not elsewhere classified: Secondary | ICD-10-CM

## 2022-03-09 DIAGNOSIS — K7460 Unspecified cirrhosis of liver: Secondary | ICD-10-CM

## 2022-03-09 NOTE — Telephone Encounter (Signed)
Called pt to resch - LEFT VM

## 2022-03-09 NOTE — Telephone Encounter (Signed)
Patient called to cancel his appt. Today at 1:00 because of a work emergency.  Please call patient to reschedule and cancel today's appt.  CB# [phone]

## 2022-03-09 NOTE — Telephone Encounter (Signed)
Patients wants to reschedule appt

## 2022-03-23 ENCOUNTER — Encounter (HOSPITAL_BASED_OUTPATIENT_CLINIC_OR_DEPARTMENT_OTHER): Payer: No Typology Code available for payment source

## 2022-05-04 ENCOUNTER — Ambulatory Visit
Admission: RE | Admit: 2022-05-04 | Discharge: 2022-05-04 | Disposition: A | Discharge status: Home / Self Care | Payer: No Typology Code available for payment source | Source: Ambulatory Visit | Attending: Nurse Practitioner

## 2022-05-04 DIAGNOSIS — K760 Fatty (change of) liver, not elsewhere classified: Secondary | ICD-10-CM

## 2022-05-04 DIAGNOSIS — K7460 Unspecified cirrhosis of liver: Secondary | ICD-10-CM

## 2022-05-11 ENCOUNTER — Ambulatory Visit (HOSPITAL_BASED_OUTPATIENT_CLINIC_OR_DEPARTMENT_OTHER): Payer: No Typology Code available for payment source

## 2022-05-11 DIAGNOSIS — G4733 Obstructive sleep apnea (adult) (pediatric): Secondary | ICD-10-CM

## 2022-05-18 ENCOUNTER — Ambulatory Visit (INDEPENDENT_AMBULATORY_CARE_PROVIDER_SITE_OTHER): Payer: No Typology Code available for payment source

## 2022-05-18 ENCOUNTER — Encounter: Payer: Self-pay | Admitting: Primary Care

## 2022-05-18 ENCOUNTER — Ambulatory Visit (INDEPENDENT_AMBULATORY_CARE_PROVIDER_SITE_OTHER): Payer: No Typology Code available for payment source | Admitting: Primary Care

## 2022-05-18 VITALS — BP 120/72 | HR 67 | Temp 97.8°F | Ht 72.0 in | Wt 260.2 lb

## 2022-05-18 DIAGNOSIS — G4733 Obstructive sleep apnea (adult) (pediatric): Secondary | ICD-10-CM | POA: Diagnosis not present

## 2022-05-18 DIAGNOSIS — R053 Chronic cough: Secondary | ICD-10-CM

## 2022-05-18 DIAGNOSIS — K219 Gastro-esophageal reflux disease without esophagitis: Secondary | ICD-10-CM | POA: Insufficient documentation

## 2022-05-18 DIAGNOSIS — R058 Other specified cough: Secondary | ICD-10-CM | POA: Diagnosis not present

## 2022-05-18 LAB — CBC WITH DIFFERENTIAL/PLATELET
Absolute Monocytes: 533 cells/uL (ref 200–950)
Eosinophils Absolute: 110 cells/uL (ref 15–500)
Eosinophils Relative: 1.5 %
MPV: 9.9 fL (ref 7.5–12.5)
Neutrophils Relative %: 69.7 %
RBC: 5.18 10*6/uL (ref 4.20–5.80)
RDW: 14.3 % (ref 11.0–15.0)
Total Lymphocyte: 21 %

## 2022-05-18 MED ORDER — BENZONATATE 100 MG PO CAPS: 100.0000 mg | ORAL_CAPSULE | Freq: Three times a day (TID) | ORAL | 1 refills | Status: AC | PRN

## 2022-05-18 MED ORDER — IPRATROPIUM BROMIDE 0.03 % NA SOLN: 2.0000 | Freq: Two times a day (BID) | NASAL | 1 refills | Status: AC

## 2022-05-18 MED ORDER — OMEPRAZOLE 20 MG PO CPDR: 20.0000 mg | DELAYED_RELEASE_CAPSULE | Freq: Two times a day (BID) | ORAL | 2 refills | Status: AC

## 2022-05-18 NOTE — Assessment & Plan Note (Signed)
-   Post viral vs UACS. He has had a productive cough since December 2024. He has associated PND and does a lot of throat clearing. Tessalon perles help. CXR in March in 2024 showed focal right basilar opacities consistent with know pneumonia. Completed course of Augmentin. Needs follow-up CXR and labs today. Maximize treatment for rhinitis and GERD.   Recommendations: - Start Atovent nasal spray >> this helps with post nasal drip that could be triggering cough  - Increase omeprazole to 20mg  twice daily (take 30 min before meals) - Take Delsym 2 tsp every 12 hrs for cough (over the counter) - Continue Tessalon perles for cough  - Start taking a daily antihistamine such as zyrtec, claritin, allegra, or xyzal >>>this medication helps with allergies, post nasal drip and cough  - Goal is to eliminate cough for 48-72 hours, we can then taper off medications and if cough returns please resume above   Cough Home Instructions:  Avoid coughing or clearing throat by using:  sugarless candy  No mint/menthol products  Sips of water and ice chips

## 2022-05-18 NOTE — Progress Notes (Signed)
@Patient  ID: Harold Freeman, male    DOB: January 15, 1971, 52 y.o.   MRN: [id number]  Chief Complaint  Patient presents with   Follow-up    Discuss HST    Referring provider: Nila Nephew, MD  HPI: 52 year old male, former smoker.  Past medical history significant for OSA, bronchitis, type 2 diabetes, ESRD, GERD, HBP.  Unattended Home Sleep Test 08/09/16-AHI 20.6/hour, desaturation to 75%, body weight 269 pounds   05/18/2022 Patient presents today d/t cough. He quit smoking in 2017. He has had a productive cough since December 2023. Sputum is beige color. No associated shortness of breath or wheezing. Tessalone perles help. CXR in March in 2024 showed focal right basilar opacities consistent with know pneumonia. Treated with Augmentin. Needs follow-up CXR.    Home sleep study from 05/12/22 showed mild OSA with AHI 12.5 and SpO2 low 77%.  He did spend 229.4 min with SpO2 < 89%. Reviewed sleep study results and treatment options. He has tried CPAP in the past but had a hard time fallings asleep. Feels mask did not fit properly. He takes trazodone 100mg  at bedtime and children melatonin. Needs CPAP titration study.   Allergies  Allergen Reactions   Mushroom Extract Complex Nausea Only and Other (See Comments)    STOMACH PAIN    Immunization History  Administered Date(s) Administered   Influenza-Unspecified 10/24/2016, 10/30/2019   Moderna Sars-Covid-2 Vaccination 04/02/2019, 04/30/2019, 11/20/2019   Pfizer Covid-19 Vaccine Bivalent Booster 66yrs & up 12/30/2020   Pneumococcal-Unspecified 01/01/2020    Past Medical History:  Diagnosis Date   Anxiety    Bronchitis    hx of   CHF (congestive heart failure) (HCC)    Coronary artery disease    by 07/04/12 cath: 40% LAD, 90% RCA; medical management   Diabetes mellitus    type 2   Dialysis patient (HCC)    Diarrhea    GERD (gastroesophageal reflux disease)    Gout    History of kidney stones    Hyperlipidemia    Hypertension     Kidney stones    Low iron    Nonischemic cardiomyopathy (HCC)    EF 35-40% by 09/06/16 echo at [facility]   Obesity    Palpitations    Pneumonia    Polycystic kidney disease    ESRD w/ fistula; dialysis M/W/F   Restless legs    Sleep apnea    unable to use cpap   Tobacco abuse     Tobacco History: Social History   Tobacco Use  Smoking Status Former   Packs/day: 1.00   Years: 26.00   Additional pack years: 0.00   Total pack years: 26.00   Types: Cigarettes   Quit date: 06/23/2015   Years since quitting: 6.9  Smokeless Tobacco Never   Counseling given: Not Answered   Outpatient Medications Prior to Visit  Medication Sig Dispense Refill   allopurinol (ZYLOPRIM) 300 MG tablet Take 300 mg by mouth every evening.      aspirin 81 MG tablet Take 81 mg by mouth daily.     buPROPion (WELLBUTRIN XL) 150 MG 24 hr tablet Take 150 mg by mouth every evening.      carvedilol (COREG) 12.5 MG tablet TAKE 25 MG TWICE DAILY EXCEPT ON DIALYSIS DAYS (M-W-F) AND TAKES 12.5 MG IN THE MORNING AND 25 MG IN THE EVENINGS  5   cholecalciferol (VITAMIN D3) 25 MCG (1000 UNIT) tablet Take 5,000 Units by mouth daily.     cinacalcet (SENSIPAR) 30 MG  tablet Take by mouth.     Continuous Blood Gluc Receiver (FREESTYLE LIBRE READER) DEVI Scan as needed to check blood sugar at least 4-5 times per day     Continuous Blood Gluc Sensor (FREESTYLE LIBRE 14 DAY SENSOR) MISC USE ONCE EVERY 14 DAYS     gabapentin (NEURONTIN) 300 MG capsule TAKE 1 CAPSULE BY MOUTH TWICE A DAY     ibuprofen (ADVIL,MOTRIN) 200 MG tablet Take 400 mg by mouth every 6 (six) hours as needed for headache or mild pain.     insulin lispro (HUMALOG) 100 UNIT/ML KwikPen 8 units with meals + add: BG150-200(2 unit);BG201-250(4 unit);ZO109-604(5 unit);BG301-350(8 unit);WU981-191(47 unit). E11.65 Z79.4     Insulin Pen Needle (FIFTY50 PEN NEEDLES) 32G X 4 MM MISC Use pen needle with insulin pen four times daily or as directed.     Magnesium 500 MG CAPS Take  1 capsule by mouth 2 (two) times daily.     mycophenolate (MYFORTIC) 180 MG EC tablet Take by mouth.     predniSONE (DELTASONE) 5 MG tablet TAKE 1 TABLET BY MOUTH DAILY GENERIC EQUIVALENT FOR DELTASONE     rOPINIRole (REQUIP) 0.25 MG tablet Take 0.25 mg by mouth at bedtime.  4   Tacrolimus 1 MG TB24 Take by mouth.     traZODone (DESYREL) 50 MG tablet TAKE 1 TABLET BY MOUTH AT BEDTIME  3   omeprazole (PRILOSEC) 20 MG capsule Take 20 mg by mouth daily.     Insulin Glargine (LANTUS SOLOSTAR) 100 UNIT/ML Solostar Pen Inject into the skin.     No facility-administered medications prior to visit.    Review of Systems  Review of Systems  Constitutional: Negative.   HENT:  Positive for postnasal drip.   Respiratory:  Positive for cough. Negative for chest tightness, shortness of breath and wheezing.   Gastrointestinal:        Reflux    Physical Exam  BP 120/72 (BP Location: Right Arm, Patient Position: Sitting, Cuff Size: Large)   Pulse 67   Temp 97.8 F (36.6 C) (Oral)   Ht 6' (1.829 m)   Wt 260 lb 3.2 oz (118 kg)   SpO2 98%   BMI 35.29 kg/m  Physical Exam Constitutional:      General: He is not in acute distress.    Appearance: Normal appearance. He is obese. He is not ill-appearing.  HENT:     Head: Normocephalic and atraumatic.     Mouth/Throat:     Mouth: Mucous membranes are moist.     Pharynx: Oropharynx is clear.  Cardiovascular:     Rate and Rhythm: Normal rate and regular rhythm.  Pulmonary:     Effort: Pulmonary effort is normal.     Breath sounds: Normal breath sounds. No wheezing, rhonchi or rales.     Comments: CTA; Throat clearing  Musculoskeletal:        General: Normal range of motion.  Skin:    General: Skin is warm and dry.  Neurological:     General: No focal deficit present.     Mental Status: He is alert and oriented to person, place, and time. Mental status is at baseline.  Psychiatric:        Mood and Affect: Mood normal.        Behavior:  Behavior normal.        Thought Content: Thought content normal.        Judgment: Judgment normal.      Lab Results:  CBC  Component Value Date/Time   WBC 8.0 08/27/2017 0046   RBC 3.73 (L) 08/27/2017 0046   HGB 12.8 (L) 08/27/2017 0046   HCT 36.1 (L) 08/27/2017 0046   PLT 169 08/27/2017 0046   MCV 96.8 08/27/2017 0046   MCH 34.3 (H) 08/27/2017 0046   MCHC 35.5 08/27/2017 0046   RDW 13.9 08/27/2017 0046   LYMPHSABS 1.5 08/27/2017 0046   MONOABS 0.6 08/27/2017 0046   EOSABS 0.3 08/27/2017 0046   BASOSABS 0.0 08/27/2017 0046    BMET    Component Value Date/Time   NA 135 07/03/2017 1946   K 4.0 07/03/2017 1946   CL 93 (L) 07/03/2017 1946   CO2 29 07/03/2017 1946   GLUCOSE 155 (H) 07/03/2017 1946   BUN 34 (H) 07/03/2017 1946   CREATININE 10.69 (H) 07/03/2017 1946   CREATININE 9.47 (H) 12/03/2011 0707   CALCIUM 8.4 (L) 07/03/2017 1946   GFRNONAA 5 (L) 07/03/2017 1946   GFRAA 6 (L) 07/03/2017 1946    BNP No results found for: "BNP"  ProBNP No results found for: "PROBNP"  Imaging: US Abdomen Complete  Result Date: 05/04/2022 CLINICAL DATA:  NASH, cirrhosis EXAM: ABDOMEN ULTRASOUND COMPLETE COMPARISON:  October 13, 2021 FINDINGS: Gallbladder: Prior cholecystectomy. Common bile duct: Diameter: 10.3 mm, dilated, likely postsurgical. Liver: No focal lesion identified. Inhomogeneous increased echotexture. Portal vein is patent on color Doppler imaging with normal direction of blood flow towards the liver. IVC: Not well visualized per ultrasound technologist. Pancreas: Visualized portion unremarkable. Spleen: The spleen measures 16.2 cm with volume of 946cm3. Echogenic focus in the spleen measuring 2.6 x 2 x 2.1 cm unchanged compared to prior ultrasound. Right Kidney: Length: 24.5 cm. Echogenicity within normal limits. No hydronephrosis visualized. Multiple cysts are identified within the kidney, largest measures 5 x 5.3 x 4.3 cm. Multicystic kidney. No follow-up is  recommended. Left Kidney: Length: 21 cm. Echogenicity within normal limits. No hydronephrosis visualized. Multiple cysts identified within the left kidney largest measures 3.5 x 3 x 2.3 cm. Multicystic kidney. No follow-up is recommended. Abdominal aorta: No aneurysm visualized. Other findings: None. IMPRESSION: 1. Inhomogeneous increased echotexture of the liver which may represent fatty infiltration and/or hepatocellular disease. 2. Prior cholecystectomy. Dilated common bile duct, likely postsurgical. 3. Splenomegaly. Echogenic focus in the spleen measuring 2.6 x 2 x 2.1 cm, unchanged compared to prior ultrasound. Electronically Signed   By: Sherian Rein M.D.   On: 05/04/2022 08:44     Assessment & Plan:   Obstructive sleep apnea - Home sleep study from 05/12/22 showed mild OSA with AHI 12.5 and SpO2 low 77%.  He did spend 229.4 min with SpO2 < 89%. Needs CPAP titration study. Advised he get wedge pillow and work on weight loss.   Upper airway cough syndrome - Post viral vs UACS. He has had a productive cough since December 2024. He has associated PND and does a lot of throat clearing. Tessalon perles help. CXR in March in 2024 showed focal right basilar opacities consistent with know pneumonia. Completed course of Augmentin. Needs follow-up CXR and labs today. Maximize treatment for rhinitis and GERD.   Recommendations: - Start Atovent nasal spray >> this helps with post nasal drip that could be triggering cough  - Increase omeprazole to 20mg  twice daily (take 30 min before meals) - Take Delsym 2 tsp every 12 hrs for cough (over the counter) - Continue Tessalon perles for cough  - Start taking a daily antihistamine such as zyrtec, claritin, allegra, or xyzal >>>this  medication helps with allergies, post nasal drip and cough  - Goal is to eliminate cough for 48-72 hours, we can then taper off medications and if cough returns please resume above   Cough Home Instructions:  Avoid coughing or  clearing throat by using:  sugarless candy  No mint/menthol products  Sips of water and ice chips    Laryngopharyngeal reflux (LPR) - Patient has reflux symptoms symptoms along with cough which is worse when he drinks water  - Recommend changing omeprazole to 20mg  twice daily - May benefit from ENT consult, patient declined today    Glenford Bayley, NP 05/18/2022

## 2022-05-18 NOTE — Patient Instructions (Addendum)
Recommendations: - Start Atovent nasal spray >> this helps with post nasal drip that could be triggering cough  - Increase omeprazole to 20mg  twice daily (take 30 min before meals)- I will refill  - Take Delsym 2 tsp every 12 hrs for cough (over the counter) - Continue Tessalon perles for cough (I can refill)  - Start taking a daily antihistamine such as zyrtec, claritin, allegra, or xyzal >>>this medication helps with allergies, post nasal drip and cough  - Goal is to eliminate cough for 48-72 hours, we can then taper off medications and if cough returns please resume above   Cough Home Instructions:  Avoid coughing or clearing throat by using:  sugarless candy  No mint/menthol products  Sips of water and ice chips  Orders: CXR today re: pneumonia follow-up Cpap titration study  Follow-up: 4 weeks with Waynetta Sandy NP

## 2022-05-18 NOTE — Assessment & Plan Note (Addendum)
-   Patient has reflux symptoms symptoms along with cough which is worse when he drinks water  - Recommend changing omeprazole to 20mg  twice daily - May benefit from ENT consult, patient declined today

## 2022-05-18 NOTE — Assessment & Plan Note (Signed)
-   Home sleep study from 05/12/22 showed mild OSA with AHI 12.5 and SpO2 low 77%.  He did spend 229.4 min with SpO2 < 89%. Needs CPAP titration study. Advised he get wedge pillow and work on weight loss.

## 2022-05-21 DIAGNOSIS — G4733 Obstructive sleep apnea (adult) (pediatric): Secondary | ICD-10-CM | POA: Diagnosis not present

## 2022-05-21 LAB — CBC WITH DIFFERENTIAL/PLATELET
Basophils Absolute: 37 cells/uL (ref 0–200)
Basophils Relative: 0.5 %
HCT: 44.6 % (ref 38.5–50.0)
Hemoglobin: 14.8 g/dL (ref 13.2–17.1)
Lymphs Abs: 1533 cells/uL (ref 850–3900)
MCH: 28.6 pg (ref 27.0–33.0)
MCHC: 33.2 g/dL (ref 32.0–36.0)
MCV: 86.1 fL (ref 80.0–100.0)
Monocytes Relative: 7.3 %
Neutro Abs: 5088 cells/uL (ref 1500–7800)
Platelets: 202 10*3/uL (ref 140–400)
WBC: 7.3 10*3/uL (ref 3.8–10.8)

## 2022-05-21 LAB — IGE: IgE (Immunoglobulin E), Serum: 11 kU/L (ref <=114)

## 2022-05-21 NOTE — Progress Notes (Signed)
Reviewed and agree with assessment/plan.   Willow Reczek, MD [facility] 05/21/2022, 8:25 AM Pager:  [phone]  

## 2022-05-22 ENCOUNTER — Telehealth: Payer: Self-pay | Admitting: Primary Care

## 2022-05-22 DIAGNOSIS — R053 Chronic cough: Secondary | ICD-10-CM

## 2022-05-22 NOTE — Telephone Encounter (Signed)
Called and spoke with pt letting him know results of cxr and recs per BW and he verbalized understanding. Nothing further needed.

## 2022-05-22 NOTE — Telephone Encounter (Signed)
Please let patient know CXR showed bibasilar opacities which could reflect atelectasis (deflated airsacs) or infection. Recommend getting CT chest to better evaluation this before sending in additional abx. I will place order if you can notify patient

## 2022-05-24 ENCOUNTER — Telehealth: Payer: Self-pay | Admitting: Primary Care

## 2022-05-24 NOTE — Telephone Encounter (Signed)
Patient he is waiting to get an in lab sleep study- I see the order for CPAP Titration is in process. However, patient got a call from [employer] to get CPAP supplies, but he does not have a CPAP machine, because he is waiting to have the in lab test. Patient would like to speak with a nurse to get clarification on the process and why CPAP supplies were called in. Please call [phone]

## 2022-05-30 NOTE — Telephone Encounter (Signed)
Beth, I looked at this pt's chart and I can not see why the order for CPAP supplies was sent. He is not using CPAP and he is awaiting CPAP titration study. I just want to make sure I am not missing something. Thank you!

## 2022-05-31 NOTE — Telephone Encounter (Signed)
Looks like that was an error. He needs CPAP titration study only

## 2022-05-31 NOTE — Telephone Encounter (Signed)
I called and spoke with the pt  I advised that the order was sent in error for the CPAP supplies   He asked about scheduling his CPAP titration study. He says it has to be done on a Friday. Will forward to PCC's to cancel CPAP supplies and see about scheduling study. Thanks!!

## 2022-06-12 NOTE — Telephone Encounter (Signed)
Pt approved for cpap titration and scheduled 6/18

## 2022-06-12 NOTE — Telephone Encounter (Signed)
Thank you :)

## 2022-06-14 NOTE — Telephone Encounter (Signed)
Left message on voicemail for patient to reschedule OV with Ames Dura, NP tomorrow. Follow up was to be after CPAP titiration study which is scheduled for 07/10/2022.

## 2022-06-15 ENCOUNTER — Ambulatory Visit: Payer: No Typology Code available for payment source | Admitting: Primary Care

## 2022-06-15 NOTE — Progress Notes (Deleted)
@Patient  ID: Harold Freeman, male    DOB: 02/02/1970, 52 y.o.   MRN: [id number]  No chief complaint on file.   Referring provider: Stevphen Rochester, MD  HPI:   06/15/2022 Patient presents today for follow-up to review sleep study results.  He had home sleep study that showed mild obstructive sleep apnea with AHI 12.5 an hour and SpO2 low 77%.  Results are suggestive of sleep-related hypoxia/hypoventilation.  Recommend patient have CPAP titration study in lab.  PAP titration study has been scheduled for 6/18.  Chest x-ray showed bibasilar opacities which could possibly reflect atelectasis or infection.  Recommended patient get CT chest to better evaluate.  Has been scheduled for 06/27/2022.   Allergies  Allergen Reactions   Mushroom Extract Complex Nausea Only and Other (See Comments)    STOMACH PAIN    Immunization History  Administered Date(s) Administered   Influenza-Unspecified 10/24/2016, 10/30/2019   Moderna Sars-Covid-2 Vaccination 04/02/2019, 04/30/2019, 11/20/2019   Pfizer Covid-19 Vaccine Bivalent Booster 36yrs & up 12/30/2020   Pneumococcal-Unspecified 01/01/2020    Past Medical History:  Diagnosis Date   Anxiety    Bronchitis    hx of   CHF (congestive heart failure) (HCC)    Coronary artery disease    by 07/04/12 cath: 40% LAD, 90% RCA; medical management   Diabetes mellitus    type 2   Dialysis patient (HCC)    Diarrhea    GERD (gastroesophageal reflux disease)    Gout    History of kidney stones    Hyperlipidemia    Hypertension    Kidney stones    Low iron    Nonischemic cardiomyopathy (HCC)    EF 35-40% by 09/06/16 echo at [facility]   Obesity    Palpitations    Pneumonia    Polycystic kidney disease    ESRD w/ fistula; dialysis M/W/F   Restless legs    Sleep apnea    unable to use cpap   Tobacco abuse     Tobacco History: Social History   Tobacco Use  Smoking Status Former   Packs/day: 1.00   Years: 26.00   Additional pack years: 0.00    Total pack years: 26.00   Types: Cigarettes   Quit date: 06/23/2015   Years since quitting: 6.9  Smokeless Tobacco Never   Counseling given: Not Answered   Outpatient Medications Prior to Visit  Medication Sig Dispense Refill   allopurinol (ZYLOPRIM) 300 MG tablet Take 300 mg by mouth every evening.      aspirin 81 MG tablet Take 81 mg by mouth daily.     benzonatate (TESSALON) 100 MG capsule Take 1 capsule (100 mg total) by mouth 3 (three) times daily as needed for cough. 60 capsule 1   buPROPion (WELLBUTRIN XL) 150 MG 24 hr tablet Take 150 mg by mouth every evening.      carvedilol (COREG) 12.5 MG tablet TAKE 25 MG TWICE DAILY EXCEPT ON DIALYSIS DAYS (M-W-F) AND TAKES 12.5 MG IN THE MORNING AND 25 MG IN THE EVENINGS  5   cholecalciferol (VITAMIN D3) 25 MCG (1000 UNIT) tablet Take 5,000 Units by mouth daily.     cinacalcet (SENSIPAR) 30 MG tablet Take by mouth.     Continuous Blood Gluc Receiver (FREESTYLE LIBRE READER) DEVI Scan as needed to check blood sugar at least 4-5 times per day     Continuous Blood Gluc Sensor (FREESTYLE LIBRE 14 DAY SENSOR) MISC USE ONCE EVERY 14 DAYS  gabapentin (NEURONTIN) 300 MG capsule TAKE 1 CAPSULE BY MOUTH TWICE A DAY     ibuprofen (ADVIL,MOTRIN) 200 MG tablet Take 400 mg by mouth every 6 (six) hours as needed for headache or mild pain.     Insulin Glargine (LANTUS SOLOSTAR) 100 UNIT/ML Solostar Pen Inject into the skin.     insulin lispro (HUMALOG) 100 UNIT/ML KwikPen 8 units with meals + add: BG150-200(2 unit);BG201-250(4 unit);ZO109-604(5 unit);BG301-350(8 unit);WU981-191(47 unit). E11.65 Z79.4     Insulin Pen Needle (FIFTY50 PEN NEEDLES) 32G X 4 MM MISC Use pen needle with insulin pen four times daily or as directed.     ipratropium (ATROVENT) 0.03 % nasal spray Place 2 sprays into both nostrils every 12 (twelve) hours. 30 mL 1   Magnesium 500 MG CAPS Take 1 capsule by mouth 2 (two) times daily.     mycophenolate (MYFORTIC) 180 MG EC tablet Take by  mouth.     omeprazole (PRILOSEC) 20 MG capsule Take 1 capsule (20 mg total) by mouth 2 (two) times daily before a meal. 60 capsule 2   predniSONE (DELTASONE) 5 MG tablet TAKE 1 TABLET BY MOUTH DAILY GENERIC EQUIVALENT FOR DELTASONE     rOPINIRole (REQUIP) 0.25 MG tablet Take 0.25 mg by mouth at bedtime.  4   Tacrolimus 1 MG TB24 Take by mouth.     traZODone (DESYREL) 50 MG tablet TAKE 1 TABLET BY MOUTH AT BEDTIME  3   No facility-administered medications prior to visit.      Review of Systems  Review of Systems   Physical Exam  There were no vitals taken for this visit. Physical Exam   Lab Results:  CBC    Component Value Date/Time   WBC 7.3 05/18/2022 1615   RBC 5.18 05/18/2022 1615   HGB 14.8 05/18/2022 1615   HCT 44.6 05/18/2022 1615   PLT 202 05/18/2022 1615   MCV 86.1 05/18/2022 1615   MCH 28.6 05/18/2022 1615   MCHC 33.2 05/18/2022 1615   RDW 14.3 05/18/2022 1615   LYMPHSABS 1,533 05/18/2022 1615   MONOABS 0.6 08/27/2017 0046   EOSABS 110 05/18/2022 1615   BASOSABS 37 05/18/2022 1615    BMET    Component Value Date/Time   NA 135 07/03/2017 1946   K 4.0 07/03/2017 1946   CL 93 (L) 07/03/2017 1946   CO2 29 07/03/2017 1946   GLUCOSE 155 (H) 07/03/2017 1946   BUN 34 (H) 07/03/2017 1946   CREATININE 10.69 (H) 07/03/2017 1946   CREATININE 9.47 (H) 12/03/2011 0707   CALCIUM 8.4 (L) 07/03/2017 1946   GFRNONAA 5 (L) 07/03/2017 1946   GFRAA 6 (L) 07/03/2017 1946    BNP No results found for: "BNP"  ProBNP No results found for: "PROBNP"  Imaging: DG Chest 2 View  Result Date: 05/21/2022 CLINICAL DATA:  History of right-sided pneumonia EXAM: CHEST - 2 VIEW COMPARISON:  Chest radiograph 12/18/2016 FINDINGS: Normal cardiac and mediastinal contours. Bibasilar heterogeneous opacities. No pleural effusion or pneumothorax. Thoracic spine degenerative changes. IMPRESSION: Bibasilar opacities may represent atelectasis or infection. Electronically Signed   By: Annia Belt M.D.   On: 05/21/2022 16:22     Assessment & Plan:   No problem-specific Assessment & Plan notes found for this encounter.     Glenford Bayley, NP 06/15/2022

## 2022-06-27 ENCOUNTER — Other Ambulatory Visit: Payer: No Typology Code available for payment source

## 2022-07-10 ENCOUNTER — Encounter (HOSPITAL_BASED_OUTPATIENT_CLINIC_OR_DEPARTMENT_OTHER): Payer: No Typology Code available for payment source | Admitting: Pulmonary Disease

## 2022-07-20 ENCOUNTER — Other Ambulatory Visit: Payer: No Typology Code available for payment source

## 2022-07-20 ENCOUNTER — Ambulatory Visit (HOSPITAL_BASED_OUTPATIENT_CLINIC_OR_DEPARTMENT_OTHER): Payer: No Typology Code available for payment source | Admitting: Pulmonary Disease

## 2022-08-07 ENCOUNTER — Telehealth: Payer: Self-pay | Admitting: Primary Care

## 2022-08-07 NOTE — Telephone Encounter (Signed)
Patient taken care of.

## 2022-08-07 NOTE — Telephone Encounter (Signed)
Pt. Calling needs to reschedule his   CPAP TITRATION  Please call back needs on a friday

## 2022-08-10 ENCOUNTER — Inpatient Hospital Stay: Admission: RE | Admit: 2022-08-10 | Payer: No Typology Code available for payment source | Source: Ambulatory Visit

## 2022-08-17 ENCOUNTER — Ambulatory Visit: Payer: No Typology Code available for payment source | Admitting: Primary Care

## 2022-08-17 ENCOUNTER — Encounter (HOSPITAL_BASED_OUTPATIENT_CLINIC_OR_DEPARTMENT_OTHER): Payer: No Typology Code available for payment source | Admitting: Pulmonary Disease

## 2022-08-24 ENCOUNTER — Ambulatory Visit: Payer: No Typology Code available for payment source | Admitting: Primary Care

## 2022-08-31 ENCOUNTER — Other Ambulatory Visit: Payer: No Typology Code available for payment source

## 2022-08-31 ENCOUNTER — Ambulatory Visit (HOSPITAL_BASED_OUTPATIENT_CLINIC_OR_DEPARTMENT_OTHER): Payer: No Typology Code available for payment source | Attending: Primary Care | Admitting: Pulmonary Disease

## 2022-08-31 DIAGNOSIS — G4733 Obstructive sleep apnea (adult) (pediatric): Secondary | ICD-10-CM | POA: Diagnosis not present

## 2022-09-07 ENCOUNTER — Ambulatory Visit
Admission: RE | Admit: 2022-09-07 | Discharge: 2022-09-07 | Disposition: A | Discharge status: Home / Self Care | Payer: No Typology Code available for payment source | Source: Ambulatory Visit | Attending: Primary Care | Admitting: Primary Care

## 2022-09-07 DIAGNOSIS — G4733 Obstructive sleep apnea (adult) (pediatric): Secondary | ICD-10-CM

## 2022-09-07 DIAGNOSIS — R053 Chronic cough: Secondary | ICD-10-CM

## 2022-09-07 MED ORDER — IOPAMIDOL (ISOVUE-300) INJECTION 61%
75.0000 mL | Freq: Once | INTRAVENOUS | Status: AC | PRN
  Administered 2022-09-07: 75 mL via INTRAVENOUS

## 2022-09-07 NOTE — Procedures (Signed)
Patient Name: Harold Freeman, Harold Freeman Date: 08/31/2022 Gender: Male D.O.B: [birth year] Age (years): 76 Referring Provider: Ames Dura NP Height (inches): 72 Interpreting Physician: Cyril Mourning MD, ABSM Weight (lbs): 260 RPSGT: Armen Pickup BMI: 35 MRN: [id number] Neck Size: 19.00 <br> <br> CLINICAL INFORMATION The patient is referred for a CPAP titration to treat sleep apnea. Home sleep test 05/12/22 showed mild OSA with AHI 12.5 and SpO2 low 77%.  He did spend 229.4 min with SpO2 < 89%      SLEEP STUDY TECHNIQUE As per the AASM Manual for the Scoring of Sleep and Associated Events v2.3 (April 2016) with a hypopnea requiring 4% desaturations.  The channels recorded and monitored were frontal, central and occipital EEG, electrooculogram (EOG), submentalis EMG (chin), nasal and oral airflow, thoracic and abdominal wall motion, anterior tibialis EMG, snore microphone, electrocardiogram, and pulse oximetry. Continuous positive airway pressure (CPAP) was initiated at the beginning of the study and titrated to treat sleep-disordered breathing.  MEDICATIONS Medications self-administered by patient taken the night of the study : COREG, MYFORTIC, TACROLIMUS, TRAZODONE  TECHNICIAN COMMENTS Comments added by technician: Pt had one restroom visted. Patient had difficulty initiating sleep. Patient was restless all through the night. Comments added by scorer: N/A RESPIRATORY PARAMETERS Optimal PAP Pressure (cm): 16 AHI at Optimal Pressure (/hr): 0 Overall Minimal O2 (%): 82.0 Supine % at Optimal Pressure (%): 100 Minimal O2 at Optimal Pressure (%): 91.0   SLEEP ARCHITECTURE The study was initiated at 9:52:00 PM and ended at 4:12:45 AM.  Sleep onset time was 25.3 minutes and the sleep efficiency was 65.5%. The total sleep time was 249.5 minutes.  The patient spent 13.2% of the night in stage N1 sleep, 77.4% in stage N2 sleep, 0.0% in stage N3 and 9.4% in REM.Stage REM latency was 212.0  minutes  Wake after sleep onset was 106.0. Alpha intrusion was absent. Supine sleep was 88.58%.  CARDIAC DATA The 2 lead EKG demonstrated sinus rhythm. The mean heart rate was 65.8 beats per minute. Other EKG findings include: None.   LEG MOVEMENT DATA The total Periodic Limb Movements of Sleep (PLMS) were 0. The PLMS index was 0.0. A PLMS index of <15 is considered normal in adults.  IMPRESSIONS - The optimal PAP pressure was 16 cm of water. - Moderate oxygen desaturations were observed during this titration (min O2 = 82.0%). - No snoring was audible during this study. - No cardiac abnormalities were observed during this study. - Clinically significant periodic limb movements were not noted during this study. Arousals associated with PLMs were rare. - He had some difficulty tolerating CPAP throught the night   DIAGNOSIS - Obstructive Sleep Apnea (G47.33)   RECOMMENDATIONS - Trial of CPAP therapy on 16 cm H2O with a Medium size Resmed Full Face AirFit F10 mask and heated humidification. Alternatively autoCPAP 10-16 cm can be used for easier pressure tolerance - Avoid alcohol, sedatives and other CNS depressants that may worsen sleep apnea and disrupt normal sleep architecture. - Sleep hygiene should be reviewed to assess factors that may improve sleep quality. - Weight management and regular exercise should be initiated or continued. - Return to [facility] for re-evaluation after 4 weeks of therapy  [Electronically signed] 09/07/2022 01:11 PM  Cyril Mourning MD, ABSM Diplomate, American Board of Sleep Medicine NPI: [id number]

## 2022-09-14 ENCOUNTER — Ambulatory Visit: Payer: No Typology Code available for payment source | Admitting: Primary Care

## 2022-09-17 NOTE — Progress Notes (Signed)
Please let patient know there was no evidence of acute cardiopulmonary disease. Lungs were clear. No pneumonia. He had severe diffuse coronary artery disease. If he does not see cardiology needs referral for CAD/

## 2022-09-28 ENCOUNTER — Ambulatory Visit: Payer: No Typology Code available for payment source | Admitting: Primary Care

## 2022-10-02 ENCOUNTER — Ambulatory Visit: Payer: No Typology Code available for payment source | Admitting: Primary Care

## 2022-10-12 ENCOUNTER — Other Ambulatory Visit: Payer: Self-pay | Admitting: Nurse Practitioner

## 2022-10-12 DIAGNOSIS — K7460 Unspecified cirrhosis of liver: Secondary | ICD-10-CM

## 2022-10-12 DIAGNOSIS — D849 Immunodeficiency, unspecified: Secondary | ICD-10-CM

## 2022-10-12 DIAGNOSIS — D7389 Other diseases of spleen: Secondary | ICD-10-CM

## 2022-10-31 ENCOUNTER — Inpatient Hospital Stay: Admission: RE | Admit: 2022-10-31 | Payer: No Typology Code available for payment source | Source: Ambulatory Visit

## 2022-11-09 ENCOUNTER — Ambulatory Visit: Payer: No Typology Code available for payment source | Admitting: Primary Care

## 2022-11-16 ENCOUNTER — Inpatient Hospital Stay: Admission: RE | Admit: 2022-11-16 | Payer: No Typology Code available for payment source | Source: Ambulatory Visit

## 2022-12-11 ENCOUNTER — Encounter: Payer: Self-pay | Admitting: Primary Care

## 2022-12-11 ENCOUNTER — Ambulatory Visit (INDEPENDENT_AMBULATORY_CARE_PROVIDER_SITE_OTHER): Payer: No Typology Code available for payment source | Admitting: Primary Care

## 2022-12-11 VITALS — BP 117/74 | HR 72 | Temp 97.4°F | Ht 73.0 in | Wt 264.2 lb

## 2022-12-11 DIAGNOSIS — G4733 Obstructive sleep apnea (adult) (pediatric): Secondary | ICD-10-CM

## 2022-12-11 NOTE — Patient Instructions (Addendum)
-OBSTRUCTIVE SLEEP APNEA: Obstructive sleep apnea is a condition where your breathing repeatedly stops and starts during sleep. We will initiate CPAP therapy with auto settings between 10-16cm h20 with nasal mask. Recommend CPAP pillow for side sleepers.   -CARDIAC FUNCTION: You have a history of reduced heart function and thickening of the heart muscle, which may contribute to your low oxygen levels during sleep. Please follow-up with cardiology.  -WEIGHT MANAGEMENT: Weight management is important for improving sleep apnea symptoms.   Recommendations: Aim to wear CPAP nightly minimum 4-6 hours or longer  Orders: AutoCPAP 10-16 cm can be used for easier pressure tolerance  Mask of choice, recommend nasal wisp   Follow-up 6-12 weeks virtual video visit with Beth NP for CPAP compliance (Friday afternoon please if able)     CPAP and BIPAP Information CPAP and BIPAP are methods that use air pressure to keep your airways open and to help you breathe well. CPAP and BIPAP use different amounts of pressure. Your health care provider will tell you whether CPAP or BIPAP would be more helpful for you. CPAP stands for "continuous positive airway pressure." With CPAP, the amount of pressure stays the same while you breathe in (inhale) and out (exhale). BIPAP stands for "bi-level positive airway pressure." With BIPAP, the amount of pressure will be higher when you inhale and lower when you exhale. This allows you to take larger breaths. CPAP or BIPAP may be used in the hospital, or your health care provider may want you to use it at home. You may need to have a sleep study before your health care provider can order a machine for you to use at home. What are the advantages? CPAP or BIPAP can be helpful if you have: Sleep apnea. Chronic obstructive pulmonary disease (COPD). Heart failure. Medical conditions that cause muscle weakness, including muscular dystrophy or amyotrophic lateral sclerosis  (ALS). Other problems that cause breathing to be shallow, weak, abnormal, or difficult. CPAP and BIPAP are most commonly used for obstructive sleep apnea (OSA) to keep the airways from collapsing when the muscles relax during sleep. What are the risks? Generally, this is a safe treatment. However, problems may occur, including: Irritated skin or skin sores if the mask does not fit properly. Dry or stuffy nose or nosebleeds. Dry mouth. Feeling gassy or bloated. Sinus or lung infection if the equipment is not cleaned properly. When should CPAP or BIPAP be used? In most cases, the mask only needs to be worn during sleep. Generally, the mask needs to be worn throughout the night and during any daytime naps. People with certain medical conditions may also need to wear the mask at other times, such as when they are awake. Follow instructions from your health care provider about when to use the machine. What happens during CPAP or BIPAP?  Both CPAP and BIPAP are provided by a small machine with a flexible plastic tube that attaches to a plastic mask that you wear. Air is blown through the mask into your nose or mouth. The amount of pressure that is used to blow the air can be adjusted on the machine. Your health care provider will set the pressure setting and help you find the best mask for you. Tips for using the mask Because the mask needs to be snug, some people feel trapped or closed-in (claustrophobic) when first using the mask. If you feel this way, you may need to get used to the mask. One way to do this is to hold  the mask loosely over your nose or mouth and then gradually apply the mask more snugly. You can also gradually increase the amount of time that you use the mask. Masks are available in various types and sizes. If your mask does not fit well, talk with your health care provider about getting a different one. Some common types of masks include: Full face masks, which fit over the mouth and  nose. Nasal masks, which fit over the nose. Nasal pillow or prong masks, which fit into the nostrils. If you are using a mask that fits over your nose and you tend to breathe through your mouth, a chin strap may be applied to help keep your mouth closed. Use a skin barrier to protect your skin as told by your health care provider. Some CPAP and BIPAP machines have alarms that may sound if the mask comes off or develops a leak. If you have trouble with the mask, it is very important that you talk with your health care provider about finding a way to make the mask easier to tolerate. Do not stop using the mask. There could be a negative impact on your health if you stop using the mask. Tips for using the machine Place your CPAP or BIPAP machine on a secure table or stand near an electrical outlet. Know where the on/off switch is on the machine. Follow instructions from your health care provider about how to set the pressure on your machine and when you should use it. Do not eat or drink while the CPAP or BIPAP machine is on. Food or fluids could get pushed into your lungs by the pressure of the CPAP or BIPAP. For home use, CPAP and BIPAP machines can be rented or purchased through home health care companies. Many different brands of machines are available. Renting a machine before purchasing may help you find out which particular machine works well for you. Your health insurance company may also decide which machine you may get. Keep the CPAP or BIPAP machine and attachments clean. Ask your health care provider for specific instructions. Check the humidifier if you have a dry stuffy nose or nosebleeds. Make sure it is working correctly. Follow these instructions at home: Take over-the-counter and prescription medicines only as told by your health care provider. Ask if you can take sinus medicine if your sinuses are blocked. Do not use any products that contain nicotine or tobacco. These products  include cigarettes, chewing tobacco, and vaping devices, such as e-cigarettes. If you need help quitting, ask your health care provider. Keep all follow-up visits. This is important. Contact a health care provider if: You have redness or pressure sores on your head, face, mouth, or nose from the mask or head gear. You have trouble using the CPAP or BIPAP machine. You cannot tolerate wearing the CPAP or BIPAP mask. Someone tells you that you snore even when wearing your CPAP or BIPAP. Get help right away if: You have trouble breathing. You feel confused. Summary CPAP and BIPAP are methods that use air pressure to keep your airways open and to help you breathe well. If you have trouble with the mask, it is very important that you talk with your health care provider about finding a way to make the mask easier to tolerate. Do not stop using the mask. There could be a negative impact to your health if you stop using the mask. Follow instructions from your health care provider about when to use the machine. This  information is not intended to replace advice given to you by your health care provider. Make sure you discuss any questions you have with your health care provider. Document Revised: 08/17/2020 Document Reviewed: 12/18/2019 Elsevier Patient Education  2023 [employer].

## 2022-12-11 NOTE — Progress Notes (Signed)
@Patient  ID: Harold Freeman, male    DOB: 07-22-1970, 52 y.o.   MRN: [id number]  Chief Complaint  Patient presents with   Follow-up    Referring provider: Stevphen Rochester, MD  HPI: 52 year old male, former smoker.  Past medical history significant for OSA, bronchitis, type 2 diabetes, ESRD, GERD, HBP.  Unattended Home Sleep Test 08/09/16-AHI 20.6/hour, desaturation to 75%, body weight 269 pounds   Previous [facility] pulmonary encounter:  05/18/2022 Patient presents today d/t cough. He quit smoking in 2017. He has had a productive cough since December 2023. Sputum is beige color. No associated shortness of breath or wheezing. Tessalone perles help. CXR in March in 2024 showed focal right basilar opacities consistent with know pneumonia. Treated with Augmentin. Needs follow-up CXR.    Home sleep study from 05/12/22 showed mild OSA with AHI 12.5 and SpO2 low 77%.  He did spend 229.4 min with SpO2 < 89%. Reviewed sleep study results and treatment options. He has tried CPAP in the past but had a hard time fallings asleep. Feels mask did not fit properly. He takes trazodone 100mg  at bedtime and children melatonin. Needs CPAP titration study.   Obstructive sleep apnea - Home sleep study from 05/12/22 showed mild OSA with AHI 12.5 and SpO2 low 77%.  He did spend 229.4 min with SpO2 < 89%. Needs CPAP titration study. Advised he get wedge pillow and work on weight loss.    Upper airway cough syndrome - Post viral vs UACS. He has had a productive cough since December 2024. He has associated PND and does a lot of throat clearing. Tessalon perles help. CXR in March in 2024 showed focal right basilar opacities consistent with know pneumonia. Completed course of Augmentin. Needs follow-up CXR and labs today. Maximize treatment for rhinitis and GERD.    Recommendations: - Start Atovent nasal spray >> this helps with post nasal drip that could be triggering cough  - Increase omeprazole to 20mg  twice daily  (take 30 min before meals) - Take Delsym 2 tsp every 12 hrs for cough (over the counter) - Continue Tessalon perles for cough  - Start taking a daily antihistamine such as zyrtec, claritin, allegra, or xyzal >>>this medication helps with allergies, post nasal drip and cough  - Goal is to eliminate cough for 48-72 hours, we can then taper off medications and if cough returns please resume above   Laryngopharyngeal reflux (LPR) - Patient has reflux symptoms symptoms along with cough which is worse when he drinks water  - Recommend changing omeprazole to 20mg  twice daily - May benefit from ENT consult, patient declined today   12/11/2022- Interim hx  Patient presents today for OSA follow-up. Discussed the use of AI scribe software for clinical note transcription with the patient, who gave verbal consent to proceed.  History of Present Illness   Patient had a home sleep study from 05/12/22 showed mild OSA with AHI 12.5 and SpO2 low 77%.  He did spend 229.4 min with SpO2 < 89%. CPAP titration study on 08/31/2022 showed optimal CPAP pressure 16cm h20. Moderate oxygen desaturations were observed during titration, min SpO2 low 82%.  No snoring noted.  No cardiac abnormalities noted.  Clinically significant periodic limb movements were noted during sleep study, arousals associated with PLM's were rare.  He had some difficulty sleeping with CPAP through the night.  The patient reported difficulty with the mask fit during the latest sleep study, attributing it to his beard. He also noted frequent awakenings  and difficulty sleeping on his side due to the equipment. The patient's total sleep time during the study was approximately 2 hours and 49 minutes, with an efficiency of 65%.  The patient's current sleep regimen includes trazodone and children's melatonin gummies, which he reported as effective in helping him fall asleep. He also noted a recent improvement in sleep continuity, with fewer awakenings to use  the bathroom.  The patient expressed reluctance to retry the CPAP machine due to previous intolerance but is open to trying nasal mask.   The patient has a history of cardiac issues, with an echocardiogram from 2016 showing diminished heart function and evidence of left ventricular hypertrophy. The patient acknowledged the need to see a cardiologist for a follow-up.    Allergies  Allergen Reactions   Mushroom Extract Complex Nausea Only and Other (See Comments)    STOMACH PAIN    Immunization History  Administered Date(s) Administered   Influenza-Unspecified 10/24/2016, 10/30/2019   Moderna Sars-Covid-2 Vaccination 04/02/2019, 04/30/2019, 11/20/2019   Pfizer Covid-19 Vaccine Bivalent Booster 65yrs & up 12/30/2020   Pneumococcal-Unspecified 01/01/2020    Past Medical History:  Diagnosis Date   Anxiety    Bronchitis    hx of   CHF (congestive heart failure) (HCC)    Coronary artery disease    by 07/04/12 cath: 40% LAD, 90% RCA; medical management   Diabetes mellitus    type 2   Dialysis patient (HCC)    Diarrhea    GERD (gastroesophageal reflux disease)    Gout    History of kidney stones    Hyperlipidemia    Hypertension    Kidney stones    Low iron    Nonischemic cardiomyopathy (HCC)    EF 35-40% by 09/06/16 echo at [facility]   Obesity    Palpitations    Pneumonia    Polycystic kidney disease    ESRD w/ fistula; dialysis M/W/F   Restless legs    Sleep apnea    unable to use cpap   Tobacco abuse     Tobacco History: Social History   Tobacco Use  Smoking Status Former   Current packs/day: 0.00   Average packs/day: 1 pack/day for 26.0 years (26.0 ttl pk-yrs)   Types: Cigarettes   Start date: 06/22/1989   Quit date: 06/23/2015   Years since quitting: 7.4  Smokeless Tobacco Never   Counseling given: Not Answered   Outpatient Medications Prior to Visit  Medication Sig Dispense Refill   allopurinol (ZYLOPRIM) 300 MG tablet Take 300 mg by mouth every evening.       aspirin 81 MG tablet Take 81 mg by mouth daily.     benzonatate (TESSALON) 100 MG capsule Take 1 capsule (100 mg total) by mouth 3 (three) times daily as needed for cough. 60 capsule 1   buPROPion (WELLBUTRIN XL) 150 MG 24 hr tablet Take 150 mg by mouth every evening.      carvedilol (COREG) 12.5 MG tablet TAKE 25 MG TWICE DAILY EXCEPT ON DIALYSIS DAYS (M-W-F) AND TAKES 12.5 MG IN THE MORNING AND 25 MG IN THE EVENINGS  5   cholecalciferol (VITAMIN D3) 25 MCG (1000 UNIT) tablet Take 5,000 Units by mouth daily.     cinacalcet (SENSIPAR) 30 MG tablet Take by mouth.     Continuous Blood Gluc Receiver (FREESTYLE LIBRE READER) DEVI Scan as needed to check blood sugar at least 4-5 times per day     Continuous Blood Gluc Sensor (FREESTYLE LIBRE 14 DAY SENSOR) MISC  USE ONCE EVERY 14 DAYS     gabapentin (NEURONTIN) 300 MG capsule TAKE 1 CAPSULE BY MOUTH TWICE A DAY     ibuprofen (ADVIL,MOTRIN) 200 MG tablet Take 400 mg by mouth every 6 (six) hours as needed for headache or mild pain.     insulin lispro (HUMALOG) 100 UNIT/ML KwikPen 8 units with meals + add: BG150-200(2 unit);BG201-250(4 unit);ZO109-604(5 unit);BG301-350(8 unit);WU981-191(47 unit). E11.65 Z79.4     Insulin Pen Needle (FIFTY50 PEN NEEDLES) 32G X 4 MM MISC Use pen needle with insulin pen four times daily or as directed.     ipratropium (ATROVENT) 0.03 % nasal spray Place 2 sprays into both nostrils every 12 (twelve) hours. 30 mL 1   Magnesium 500 MG CAPS Take 1 capsule by mouth 2 (two) times daily.     mycophenolate (MYFORTIC) 180 MG EC tablet Take by mouth.     omeprazole (PRILOSEC) 20 MG capsule Take 1 capsule (20 mg total) by mouth 2 (two) times daily before a meal. 60 capsule 2   predniSONE (DELTASONE) 5 MG tablet TAKE 1 TABLET BY MOUTH DAILY GENERIC EQUIVALENT FOR DELTASONE     rOPINIRole (REQUIP) 0.25 MG tablet Take 0.25 mg by mouth at bedtime.  4   Tacrolimus 1 MG TB24 Take by mouth.     traZODone (DESYREL) 50 MG tablet TAKE 1 TABLET BY  MOUTH AT BEDTIME  3   Insulin Glargine (LANTUS SOLOSTAR) 100 UNIT/ML Solostar Pen Inject into the skin.     No facility-administered medications prior to visit.   Review of Systems  Review of Systems  Constitutional: Negative.   Respiratory: Negative.    Psychiatric/Behavioral:  Positive for sleep disturbance.    Physical Exam  BP 117/74 (BP Location: Right Arm, Patient Position: Sitting, Cuff Size: Normal)   Pulse 72   Temp (!) 97.4 F (36.3 C) (Oral)   Ht 6\' 1"  (1.854 m)   Wt 264 lb 3.2 oz (119.8 kg)   SpO2 96%   BMI 34.86 kg/m  Physical Exam Constitutional:      Appearance: Normal appearance.  HENT:     Head: Normocephalic and atraumatic.  Cardiovascular:     Rate and Rhythm: Normal rate and regular rhythm.  Pulmonary:     Effort: Pulmonary effort is normal.     Breath sounds: Normal breath sounds.  Musculoskeletal:        General: Normal range of motion.  Skin:    General: Skin is warm and dry.  Neurological:     General: No focal deficit present.     Mental Status: He is alert and oriented to person, place, and time. Mental status is at baseline.  Psychiatric:        Mood and Affect: Mood normal.        Behavior: Behavior normal.        Thought Content: Thought content normal.        Judgment: Judgment normal.      Lab Results:  CBC    Component Value Date/Time   WBC 7.3 05/18/2022 1615   RBC 5.18 05/18/2022 1615   HGB 14.8 05/18/2022 1615   HCT 44.6 05/18/2022 1615   PLT 202 05/18/2022 1615   MCV 86.1 05/18/2022 1615   MCH 28.6 05/18/2022 1615   MCHC 33.2 05/18/2022 1615   RDW 14.3 05/18/2022 1615   LYMPHSABS 1,533 05/18/2022 1615   MONOABS 0.6 08/27/2017 0046   EOSABS 110 05/18/2022 1615   BASOSABS 37 05/18/2022 1615    BMET  Component Value Date/Time   NA 135 07/03/2017 1946   K 4.0 07/03/2017 1946   CL 93 (L) 07/03/2017 1946   CO2 29 07/03/2017 1946   GLUCOSE 155 (H) 07/03/2017 1946   BUN 34 (H) 07/03/2017 1946   CREATININE 10.69  (H) 07/03/2017 1946   CREATININE 9.47 (H) 12/03/2011 0707   CALCIUM 8.4 (L) 07/03/2017 1946   GFRNONAA 5 (L) 07/03/2017 1946   GFRAA 6 (L) 07/03/2017 1946    BNP No results found for: "BNP"  ProBNP No results found for: "PROBNP"  Imaging: No results found.   Assessment & Plan:   1. OSA (obstructive sleep apnea) - Ambulatory Referral for DME  Obstructive Sleep Apnea Mild severity with significant oxygen desaturation. Previous intolerance to CPAP therapy but is open to re-trying with nasal mask.  -Initiate CPAP therapy with auto settings between 10 - 16cm h20  -Advised patient aim to wear CPAP nightly 4-6 hours  -Recommend use of a CPAP pillow for side sleepers. -FU in 31-90 days for compliance check in   Insomnia - Improved; Continue Trazodone 100mg  at bedtime and Melatonin for sleep aid.  Cardiac Function History of diminished ejection fraction and left ventricular hypertrophy. Concern for cardiac contribution to oxygen desaturation during sleep. -Follow up with cardiology  Weight Management -Encourage weight loss to improve sleep apnea symptoms.  40 mins spent on case > 50% face to face  Glenford Bayley, NP 12/11/2022

## 2023-02-01 ENCOUNTER — Ambulatory Visit
Admission: RE | Admit: 2023-02-01 | Discharge: 2023-02-01 | Disposition: A | Discharge status: Home / Self Care | Payer: No Typology Code available for payment source | Source: Ambulatory Visit | Attending: Nurse Practitioner

## 2023-02-01 DIAGNOSIS — D7389 Other diseases of spleen: Secondary | ICD-10-CM

## 2023-02-01 DIAGNOSIS — D849 Immunodeficiency, unspecified: Secondary | ICD-10-CM

## 2023-02-01 DIAGNOSIS — K7460 Unspecified cirrhosis of liver: Secondary | ICD-10-CM

## 2023-02-01 MED ORDER — IOPAMIDOL (ISOVUE-300) INJECTION 61%
100.0000 mL | Freq: Once | INTRAVENOUS | Status: AC | PRN
  Administered 2023-02-01: 100 mL via INTRAVENOUS

## 2023-02-14 ENCOUNTER — Telehealth: Payer: Self-pay | Admitting: Primary Care

## 2023-02-14 NOTE — Telephone Encounter (Signed)
Patient states returning CPAP machine to [facility]. Patient states not able to fall asleep with CPAP machine. Patient phone number is [phone].

## 2023-02-20 NOTE — Telephone Encounter (Signed)
I called the pt and there was no answer- LMTCB. ?

## 2023-04-26 NOTE — Progress Notes (Signed)
 A1c was 7.0 on 03/15/23. Pt BG is 179 at time of visit per his monitor. Sari Cody Inks, RN

## 2023-06-24 NOTE — Progress Notes (Signed)
 Subjective   Patient ID:  Harold Freeman is a 53 y.o. (DOB Jun 27, 1970) male    Patient presents with  . Diabetes  . Hyperlipidemia  . Hypertension  . Chronic Kidney Disease     HPI:  Harold Freeman is here today for follow up DM, HLD, HTN, and CKD.  He is checking his blood sugars.  He reports some hypoglycemic episodes in the afternoons despite what he eats.  He reports symptomatic with the hypoglycemia.   He was last seen by endocrinology last month and the plan is to d/c  Ozempic and start Mounjaro once he finishes his last box of Ozempic.  His last A1C was 7.0 in this office.  He exercises very little but tries to do his best with a low carb diet.  His kidneys are stable at this time.  He is followed by nephrology and has an upcoming appointment next month.  His cholesterol was stable at this last visit.  His blood pressure runs on the low side of normal.  He denies CP, SOB, palpitations, or dizziness.  He adheres to his medication regimen as his wife makes sure of this.   Reviewed and updated this visit by provider: Tobacco  Allergies  Meds  Problems  Med Hx  Surg Hx  Fam Hx        Review of Systems  is complete and negative except as noted.  Objective   Vitals:   06/21/23 1139  BP: 99/64  Patient Position: Sitting  Pulse: 86  Temp: 97.8 F (36.6 C)  TempSrc: Temporal  Height: 5' 11.5 (1.816 m)  Weight: 262 lb (118.8 kg)  SpO2: 96%  BMI (Calculated): 36  PainSc:   4  PainLoc: Back     Physical Exam Constitutional:      General: He is not in acute distress.    Appearance: Normal appearance. He is obese.  Cardiovascular:     Rate and Rhythm: Normal rate and regular rhythm.     Pulses: Normal pulses.     Heart sounds: Normal heart sounds. No murmur heard.    Comments: Left forearm vascular nodule  Musculoskeletal:        General: Normal range of motion.     Right lower leg: No edema.     Left lower leg: No edema.  Pulmonary:     Effort: Pulmonary effort is  normal. No respiratory distress.     Breath sounds: Normal breath sounds.  Skin:    General: Skin is warm and dry.     Capillary Refill: Capillary refill takes less than 2 seconds.  Neurological:     Mental Status: He is alert and oriented to person, place, and time. Mental status is at baseline.  Psychiatric:        Mood and Affect: Mood normal.        Behavior: Behavior is withdrawn.        Thought Content: Thought content normal.        Assessment and Plan  1. Type 2 diabetes mellitus with stage 3b chronic kidney disease, with long-term current use of insulin (*) (Primary) -     POCT A1C 2. Tertiary hyperparathyroidism (*) 3. Cirrhosis of liver without ascites, unspecified hepatic cirrhosis type (*) 4. Immunodeficiency due to drugs (*) 5. Primary hypertension 6. Pure hypertriglyceridemia 7. Class 2 severe obesity due to excess calories with serious comorbidity and body mass index (BMI) of 36.0 to 36.9 in adult (*)  A1C today 6.6 under much better control.  He will continue his insulin as prescribed and managed by Endocrine Discussed risks and benefits of d/c of Ozempic and start Mounjaro based on endocrine recommendation He will update us  once he starts Mounjaro BP is stable and under control Continue following w/ nephrology Recommend he incorporate more water daily  Cholesterol at last visit was stable, will repeat blood test at next visit.  Follow up in about 3 months (around 09/21/2023) for HTN DM CKD HLD-30.      Patient's Medications  New Prescriptions   No medications on file  Previous Medications   ALBUTEROL SULFATE HFA (PROVENTIL,VENTOLIN,PROAIR) 108 (90 BASE) MCG/ACT INHALER    Inhale two puffs into the lungs every 4 (four) hours as needed for Wheezing.   ASPIRIN  81 MG EC TABLET    daily.   BD PEN NEEDLE NANO U/F 32G X 4 MM MISC    USE 5 TIMES DAILY OR AS DIRECTED WITH INSULIN   BUPROPION (WELLBUTRIN SR) 150 MG 12 HR TABLET    Take one tablet (150 mg dose) by  mouth 2 (two) times daily.   CARVEDILOL  (COREG ) 12.5 MG TABLET    Take one tablet (12.5 mg dose) by mouth 2 (two) times daily with meals.   CETIRIZINE (ZYRTEC) 10 MG TABLET    Take one tablet (10 mg dose) by mouth daily.   CINACALCET (SENSIPAR) 30 MG TABLET    Take one tablet (30 mg dose) by mouth.   INSULIN LISPRO, HUMAN, (HUMALOG KWIKPEN) 200 UNIT/ML INJECTION    Inject  20-46 units three times a day as directed.  Maximum of 138 units daily   IPRATROPIUM (ATROVENT ) 0.03% NASAL SPRAY    two sprays by Nasal route 2 (two) times daily.   LISINOPRIL (PRINIVIL,ZESTRIL) 5 MG TABLET    Take one tablet (5 mg dose) by mouth daily.   MAGNESIUM-OXIDE 400 (241.3 MG) MG    Take one tablet (400 mg dose) by mouth 2 (two) times daily.   MOUNJARO 7.5 MG/0.5 ML SOAJ INJECTION    Inject 0.5 mLs (7.5 mg dose) into the skin every 7 (seven) days.   MYCOPHENOLATE MOFETIL (CELLCEPT) 500 MG TABLET       MYCOPHENOLIC ACID (MYFORTIC) 180 MG EC TABLET    SMARTSIG:Tablet(s) By Mouth   OMEPRAZOLE  (PRILOSEC) 40 MG CAPSULE    Take one capsule (40 mg dose) by mouth daily.   PREDNISONE (DELTASONE) 5 MG TABLET    Take one tablet (5 mg dose) by mouth daily.   ROPINIROLE (REQUIP) 0.25 MG TABLET    Take one tablet (0.25 mg dose) by mouth 3 (three) times a day.   SEMAGLUTIDE, 1 MG/DOSE, (OZEMPIC, 1 MG/DOSE,) 4 MG/3ML SOPN PEN    Inject 1 mg into the skin once a week at 0900.   TACROLIMUS (PROGRAF) 1 MG CAPSULE    Take two capsules (2 mg dose) by mouth 2 (two) times daily.   TRAZODONE (DESYREL) 100 MG TABLET    Take one tablet (100 mg dose) by mouth at bedtime.   TRAZODONE (DESYREL) 50 MG TABLET    Take 1 tablet by mouth at bedtime  Modified Medications   No medications on file  Discontinued Medications   No medications on file      Risks, benefits, and alternatives of the medications and treatment plan prescribed today were discussed, and patient expressed understanding. Plan follow-up as discussed or as needed if any worsening  symptoms or change in condition.   A yearly preventative health exam was recommended and  current age based recommendations were discussed.

## 2023-07-03 ENCOUNTER — Other Ambulatory Visit: Payer: Self-pay | Admitting: Nurse Practitioner

## 2023-07-03 DIAGNOSIS — K7581 Nonalcoholic steatohepatitis (NASH): Secondary | ICD-10-CM

## 2023-07-03 DIAGNOSIS — K7460 Unspecified cirrhosis of liver: Secondary | ICD-10-CM

## 2023-07-19 NOTE — Telephone Encounter (Signed)
 Called patient to remind him to schedule US . He cancelled ultrasound appointment, stating getting an US  every 6 months creates a financial burden. He would like to know if imaging can be every 9 months. He reports being unable to spend an extra $100 at the moment. Advised I would speak with [facility] regarding request.

## 2023-07-19 NOTE — Telephone Encounter (Signed)
 My chart message sent to patient reviewing risk associated with delaying HCC screening including delayed diagnosis that can impact ability to provide curative treatment.    Requested that patient schedule his US  and let us  know when he schedules it for whether now or if he schedules for a future date.

## 2023-09-17 NOTE — Telephone Encounter (Signed)
 Patient has not scheduled his ultrasound. Unable to reach by phone and voicemail is full. He did not read his previous MyChart messages. We will mail a letter with Dawn's explanation of the importance of routine ultrasounds. The letter also provides the number to [facility].

## 2023-09-24 NOTE — Telephone Encounter (Signed)
 LM for patient making him aware that Rosaline had reached out to remind him to schedule his 20-month screening abdominal ultrasound and to call us  back with any questions he may have.

## 2023-09-24 NOTE — Telephone Encounter (Signed)
 Pt would like a call back regarding a letter he received from the nurse

## 2023-10-04 NOTE — Progress Notes (Signed)
 Subjective   Patient ID:  Harold Freeman is a 53 y.o. (DOB 1970/04/09) male    Patient presents with  . Hypertension  . Diabetes  . Hyperlipidemia     History of Present Illness The patient is a 53 year old individual presenting for routine follow-up on chronic conditions. They have history of  polycystic kidney disease and underwent a kidney transplant. They are on immunosuppressants and under Dr. Anthony care at [facility], with their next appointment scheduled for October 2025 or early November 2025. They see Dr. Gretel for diabetes management and are on Mounjaro, which they report as beneficial for blood sugar control. Their spouse manages their medication orders and refills. They note decreased appetite, especially at dinner, and feel full quickly. Their diet includes deli meat, sandwiches, and boiled eggs about three out of five days. They also have sausage biscuit in the morning sometimes and drink mountain dew.  They adhere to their insulin regimen but occasionally wake up with elevated blood sugar levels. Dr. Tobie advised consuming peanut butter at night to stabilize blood sugar, which they find effective.   They experience intermittent dizziness/lightheadedness, attributed to standing up quickly at work, but nothing concerning or new/worsening. They have a sedentary job and occasionally check their blood pressure, they are not concerned about it dropping too low. They take bupropion twice daily, but unsure why. They take trazodone at bedtime, which helps them sleep, though they wake up periodically to use the bathroom. They experience occasional leg cramps and variable sleep patterns, typically going to bed between 8:30 and 9:00 PM and waking up around 4:30 AM to 5:00 AM. They tried using a CPAP machine but found it uncomfortable and disruptive.   They report daily pain in their ankles, knees, arms, shoulders, back, and wrists. They describe foot pain as feeling like a fracture. They have  a history of ACL and meniscus tears in both knees, occasionally receiving cortisone injections. They do not take regular pain medication. They occasionally take Tylenol  but find it ineffective. They do not experience chest pain.   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    Review of Systems   Objective  BP (!) 103/59 (BP Location: Right Upper Arm, Patient Position: Sitting)   Pulse 78   Temp 97.5 F (36.4 C) (Temporal)   Ht 5' 11.5 (1.816 m)   Wt 262 lb 3.2 oz (118.9 kg)   SpO2 97%   BMI 36.06 kg/m    Physical Exam Vitals reviewed.  HENT:     Head: Normocephalic and atraumatic.  Cardiovascular:     Rate and Rhythm: Normal rate and regular rhythm.  Musculoskeletal:     Right lower leg: No edema.     Left lower leg: No edema.  Pulmonary:     Effort: Pulmonary effort is normal.     Breath sounds: Normal breath sounds.  Neurological:     Mental Status: He is alert.     Physical Exam    Results Lab Results  Component Value Date   Hemoglobin A1c 6.6 (A) 06/21/2023   Lab Results  Component Value Date   Creatinine 1.47 (H) 03/15/2023   BUN 18 03/15/2023   Sodium 135 03/15/2023   Potassium 5.0 03/15/2023   Chloride 100 03/15/2023   CO2 20 03/15/2023     Assessment and Plan   Assessment & Plan 1. CKD: - Last Kidney function tests stable. Monitor today. - Continue regular follow-ups with Dr. Tobie every 6  months  2. Diabetes mellitus: - Weight stable at 262 pounds since 05/2023 - Mounjaro helps control blood sugar and curbs appetite - Continue Mounjaro as prescribed by Dr. Gretel and keep all f/u with her. -Monitor A1c today.  3. Hypertension/CHF: - Blood pressure baseline for pt today-- today's reading 103/59 mmHg. He is asymptomatic of hypotension. I counseled him on what to look for and to report. Encourage slow position changes.   - No adjustments to blood pressure medications necessary as his BP is baseline for him and he is  asymptomatic. -He will continue to follow with his cardiologist routinely.   4. Sleep disturbances: - Takes trazodone 100 mg at bedtime for sleep - Refill provided  5. RLS - Takes requip without issues. -Refill provided   No med changes were made today   Follow-up: - Follow up in 6 months for wellness visit  Orders Placed This Encounter  Procedures  . Basic metabolic panel    Standing Status:   Future    Number of Occurrences:   1    Expiration Date:   10/03/2024  . Hemoglobin A1c    Standing Status:   Future    Number of Occurrences:   1    Expiration Date:   10/03/2024    Orders Placed This Encounter  Medications  . carvedilol  (COREG ) 12.5 mg tablet    Sig: Take one tablet (12.5 mg dose) by mouth 2 (two) times daily with meals.    Dispense:  90 tablet    Refill:  0  . rOPINIRole (REQUIP) 0.25 mg tablet    Sig: Take one tablet (0.25 mg dose) by mouth 3 (three) times a day.    Dispense:  90 tablet    Refill:  0  . traZODone (DESYREL) 100 mg tablet    Sig: Take one tablet (100 mg dose) by mouth at bedtime.    Dispense:  90 tablet    Refill:  1     Laymon KATHEE Ancona, DNP   Follow up in about 6 months (around 04/02/2024) for wellness visit.

## 2024-01-10 ENCOUNTER — Inpatient Hospital Stay: Admission: RE | Admit: 2024-01-10 | Discharge: 2024-01-10 | Attending: Nurse Practitioner

## 2024-01-10 DIAGNOSIS — K7581 Nonalcoholic steatohepatitis (NASH): Secondary | ICD-10-CM

## 2024-01-10 DIAGNOSIS — K7469 Other cirrhosis of liver: Secondary | ICD-10-CM
